# Patient Record
Sex: Female | Born: 1975 | Race: Black or African American | Hispanic: No | Marital: Single | Smoking: Never smoker
Health system: Southern US, Community
[De-identification: ages and names within clinical notes are randomized; demographics above are authoritative.]

## PROBLEM LIST (undated history)

## (undated) DIAGNOSIS — F32A Depression, unspecified: Secondary | ICD-10-CM

## (undated) DIAGNOSIS — F419 Anxiety disorder, unspecified: Secondary | ICD-10-CM

## (undated) DIAGNOSIS — D649 Anemia, unspecified: Secondary | ICD-10-CM

## (undated) DIAGNOSIS — F329 Major depressive disorder, single episode, unspecified: Secondary | ICD-10-CM

## (undated) DIAGNOSIS — E119 Type 2 diabetes mellitus without complications: Secondary | ICD-10-CM

## (undated) DIAGNOSIS — D0510 Intraductal carcinoma in situ of unspecified breast: Secondary | ICD-10-CM

## (undated) HISTORY — PX: OVARIAN CYST REMOVAL: SHX89

---

## 2015-03-17 DIAGNOSIS — E785 Hyperlipidemia, unspecified: Secondary | ICD-10-CM | POA: Insufficient documentation

## 2016-12-21 ENCOUNTER — Other Ambulatory Visit: Payer: Self-pay | Admitting: Family Medicine

## 2016-12-21 DIAGNOSIS — N63 Unspecified lump in unspecified breast: Secondary | ICD-10-CM

## 2016-12-28 ENCOUNTER — Ambulatory Visit
Admission: RE | Admit: 2016-12-28 | Discharge: 2016-12-28 | Disposition: A | Discharge status: Home / Self Care | Payer: BLUE CROSS/BLUE SHIELD | Source: Ambulatory Visit | Attending: Family Medicine | Admitting: Family Medicine

## 2016-12-28 DIAGNOSIS — N6489 Other specified disorders of breast: Secondary | ICD-10-CM | POA: Insufficient documentation

## 2016-12-28 DIAGNOSIS — N6321 Unspecified lump in the left breast, upper outer quadrant: Secondary | ICD-10-CM | POA: Insufficient documentation

## 2016-12-28 DIAGNOSIS — N63 Unspecified lump in unspecified breast: Secondary | ICD-10-CM

## 2017-01-04 ENCOUNTER — Other Ambulatory Visit: Payer: Self-pay | Admitting: Family Medicine

## 2017-01-04 DIAGNOSIS — R928 Other abnormal and inconclusive findings on diagnostic imaging of breast: Secondary | ICD-10-CM

## 2017-01-12 ENCOUNTER — Ambulatory Visit
Admission: RE | Admit: 2017-01-12 | Discharge: 2017-01-12 | Disposition: A | Discharge status: Home / Self Care | Payer: BLUE CROSS/BLUE SHIELD | Source: Ambulatory Visit | Attending: Family Medicine | Admitting: Family Medicine

## 2017-01-12 DIAGNOSIS — R928 Other abnormal and inconclusive findings on diagnostic imaging of breast: Secondary | ICD-10-CM

## 2017-01-12 DIAGNOSIS — N6082 Other benign mammary dysplasias of left breast: Secondary | ICD-10-CM | POA: Insufficient documentation

## 2017-01-27 LAB — SURGICAL PATHOLOGY

## 2017-02-17 ENCOUNTER — Other Ambulatory Visit: Payer: Self-pay

## 2017-02-17 ENCOUNTER — Encounter
Admission: RE | Admit: 2017-02-17 | Discharge: 2017-02-17 | Disposition: A | Discharge status: Home / Self Care | Payer: BLUE CROSS/BLUE SHIELD | Source: Ambulatory Visit | Attending: Surgery | Admitting: Surgery

## 2017-02-17 HISTORY — DX: Major depressive disorder, single episode, unspecified: F32.9

## 2017-02-17 HISTORY — DX: Depression, unspecified: F32.A

## 2017-02-17 HISTORY — DX: Anxiety disorder, unspecified: F41.9

## 2017-02-17 HISTORY — DX: Type 2 diabetes mellitus without complications: E11.9

## 2017-02-17 HISTORY — DX: Anemia, unspecified: D64.9

## 2017-02-17 NOTE — Patient Instructions (Signed)
  Your procedure is scheduled on: 02-24-17 THURSDAY Report to Same Day Surgery 2nd floor medical mall [facility](Medical Mall Entrance-take elevator on left to 2nd floor.  Check in with surgery information desk.) To find out your arrival time please call [phone] between 1PM - 3PM [facility]  Remember: Instructions that are not followed completely may result in serious medical risk, up to and including death, or upon the discretion of your surgeon and anesthesiologist your surgery may need to be rescheduled.    _x___ 1. Do not eat food after midnight the night before your procedure. NO GUM OR CANDY AFTER MIDNIGHT.  You may drink clear liquids up to 2 hours before you are scheduled to arrive at the hospital for your procedure.  Do not drink clear liquids within 2 hours of your scheduled arrival to the hospital.  Clear liquids include  --Water or Apple juice without pulp  --Clear carbohydrate beverage such as ClearFast or Gatorade  --Black Coffee or Clear Tea (No milk, no creamers, do not add anything to the coffee or Tea     __x__ 2. No Alcohol for 24 hours before or after surgery.   __x__3. No Smoking for 24 prior to surgery.   ____  4. Bring all medications with you on the day of surgery if instructed.    __x__ 5. Notify your doctor if there is any change in your medical condition     (cold, fever, infections).     Do not wear jewelry, make-up, hairpins, clips or nail polish.  Do not wear lotions, powders, or perfumes. You may wear deodorant.  Do not shave 48 hours prior to surgery. Men may shave face and neck.  Do not bring valuables to the hospital.    [facility] is not responsible for any belongings or valuables.               Contacts, dentures or bridgework may not be worn into surgery.  Leave your suitcase in the car. After surgery it may be brought to your room.  For patients admitted to the hospital, discharge time is determined by your treatment team.   Patients  discharged the day of surgery will not be allowed to drive home.  You will need someone to drive you home and stay with you the night of your procedure.    Please read over the following fact sheets that you were given:   [facility] Pre[facility] and or MRSA Information    ____ TAKE THE FOLLOWING MEDICATION THE MORNING OF SURGERY WITH A SMALL SIP OF WATER. These include:  1. NONE  2.  3.  4.  5.  6.  ____Fleets enema or Magnesium Citrate as directed.   _x___ Use CHG Soap or sage wipes as directed on instruction sheet   ____ Use inhalers on the day of surgery and bring to hospital day of surgery  ____ Stop Metformin and Janumet 2 days prior to surgery.    ____ Take 1/2 of usual insulin dose the night before surgery and none on the morning surgery.   ____ Follow recommendations from Cardiologist, Pulmonologist or PCP regarding stopping Aspirin, Coumadin, Plavix ,Eliquis, Effient, or Pradaxa, and Pletal.  X____Stop Anti-inflammatories such as Advil, Aleve, Ibuprofen, Motrin, Naproxen, Naprosyn, Goodies powders or aspirin products NOW-OK to take Tylenol   ____ Stop supplements until after surgery.     ____ Bring C-Pap to the hospital.

## 2017-02-18 ENCOUNTER — Encounter
Admission: RE | Admit: 2017-02-18 | Discharge: 2017-02-18 | Disposition: A | Discharge status: Home / Self Care | Payer: BLUE CROSS/BLUE SHIELD | Source: Ambulatory Visit | Attending: Surgery | Admitting: Surgery

## 2017-02-18 DIAGNOSIS — Z01810 Encounter for preprocedural cardiovascular examination: Secondary | ICD-10-CM | POA: Insufficient documentation

## 2017-02-18 DIAGNOSIS — E119 Type 2 diabetes mellitus without complications: Secondary | ICD-10-CM | POA: Insufficient documentation

## 2017-02-18 DIAGNOSIS — Z01812 Encounter for preprocedural laboratory examination: Secondary | ICD-10-CM | POA: Insufficient documentation

## 2017-02-18 LAB — BASIC METABOLIC PANEL
Anion gap: 10 (ref 5–15)
BUN: 15 mg/dL (ref 6–20)
CALCIUM: 9 mg/dL (ref 8.9–10.3)
CO2: 18 mmol/L — AB (ref 22–32)
CREATININE: 0.84 mg/dL (ref 0.44–1.00)
Chloride: 108 mmol/L (ref 101–111)
GFR calc Af Amer: >60 mL/min (ref >60)
GFR calc non Af Amer: >60 mL/min (ref >60)
Glucose, Bld: 214 mg/dL — ABNORMAL HIGH (ref 65–99)
Potassium: 4.8 mmol/L (ref 3.5–5.1)
Sodium: 136 mmol/L (ref 135–145)

## 2017-02-24 ENCOUNTER — Encounter: Payer: Self-pay | Admitting: *Deleted

## 2017-02-24 ENCOUNTER — Ambulatory Visit: Payer: BLUE CROSS/BLUE SHIELD | Admitting: Certified Registered"

## 2017-02-24 ENCOUNTER — Encounter
Admission: RE | Disposition: A | Discharge status: Home / Self Care | Payer: Self-pay | Source: Ambulatory Visit | Attending: Surgery

## 2017-02-24 ENCOUNTER — Ambulatory Visit
Admission: RE | Admit: 2017-02-24 | Discharge: 2017-02-24 | Disposition: A | Discharge status: Home / Self Care | Payer: BLUE CROSS/BLUE SHIELD | Source: Ambulatory Visit | Attending: Surgery | Admitting: Surgery

## 2017-02-24 ENCOUNTER — Other Ambulatory Visit: Payer: Self-pay

## 2017-02-24 DIAGNOSIS — Z7984 Long term (current) use of oral hypoglycemic drugs: Secondary | ICD-10-CM | POA: Diagnosis not present

## 2017-02-24 DIAGNOSIS — F418 Other specified anxiety disorders: Secondary | ICD-10-CM | POA: Insufficient documentation

## 2017-02-24 DIAGNOSIS — D0512 Intraductal carcinoma in situ of left breast: Secondary | ICD-10-CM | POA: Insufficient documentation

## 2017-02-24 DIAGNOSIS — N6320 Unspecified lump in the left breast, unspecified quadrant: Secondary | ICD-10-CM | POA: Diagnosis present

## 2017-02-24 HISTORY — PX: BREAST LUMPECTOMY: SHX2

## 2017-02-24 LAB — GLUCOSE, CAPILLARY
GLUCOSE-CAPILLARY: 192 mg/dL — AB (ref 65–99)
Glucose-Capillary: 227 mg/dL — ABNORMAL HIGH (ref 65–99)

## 2017-02-24 LAB — POCT PREGNANCY, URINE: PREG TEST UR: NEGATIVE

## 2017-02-24 SURGERY — BREAST LUMPECTOMY
Anesthesia: General | Laterality: Left | Wound class: Clean

## 2017-02-24 MED ORDER — MIDAZOLAM HCL 2 MG/2ML IJ SOLN
INTRAMUSCULAR | Status: DC | PRN
  Administered 2017-02-24: 2 mg via INTRAVENOUS

## 2017-02-24 MED ORDER — SODIUM CHLORIDE 0.9 % IV SOLN
INTRAVENOUS | Status: DC
  Administered 2017-02-24: 07:00:00 via INTRAVENOUS

## 2017-02-24 MED ORDER — FENTANYL CITRATE (PF) 100 MCG/2ML IJ SOLN
INTRAMUSCULAR | Status: AC
  Filled 2017-02-24: qty 2

## 2017-02-24 MED ORDER — DEXAMETHASONE SODIUM PHOSPHATE 10 MG/ML IJ SOLN
INTRAMUSCULAR | Status: AC
  Filled 2017-02-24: qty 1

## 2017-02-24 MED ORDER — FAMOTIDINE 20 MG PO TABS
20.0000 mg | ORAL_TABLET | Freq: Once | ORAL | Status: AC
  Administered 2017-02-24: 20 mg via ORAL

## 2017-02-24 MED ORDER — ONDANSETRON HCL 4 MG/2ML IJ SOLN
INTRAMUSCULAR | Status: AC
  Filled 2017-02-24: qty 2

## 2017-02-24 MED ORDER — ONDANSETRON HCL 4 MG/2ML IJ SOLN: 4.0000 mg | Freq: Once | INTRAMUSCULAR | Status: DC | PRN

## 2017-02-24 MED ORDER — PHENYLEPHRINE HCL 10 MG/ML IJ SOLN
INTRAMUSCULAR | Status: DC | PRN
  Administered 2017-02-24 (×2): 100 ug via INTRAVENOUS

## 2017-02-24 MED ORDER — DEXAMETHASONE SODIUM PHOSPHATE 10 MG/ML IJ SOLN
INTRAMUSCULAR | Status: DC | PRN
  Administered 2017-02-24: 5 mg via INTRAVENOUS

## 2017-02-24 MED ORDER — ONDANSETRON HCL 4 MG/2ML IJ SOLN
INTRAMUSCULAR | Status: DC | PRN
  Administered 2017-02-24: 4 mg via INTRAVENOUS

## 2017-02-24 MED ORDER — LIDOCAINE HCL (CARDIAC) 20 MG/ML IV SOLN
INTRAVENOUS | Status: DC | PRN
  Administered 2017-02-24: 100 mg via INTRAVENOUS

## 2017-02-24 MED ORDER — FAMOTIDINE 20 MG PO TABS
ORAL_TABLET | ORAL | Status: AC
  Administered 2017-02-24: 20 mg via ORAL
  Filled 2017-02-24: qty 1

## 2017-02-24 MED ORDER — PROPOFOL 10 MG/ML IV BOLUS
INTRAVENOUS | Status: AC
  Filled 2017-02-24: qty 20

## 2017-02-24 MED ORDER — PROPOFOL 10 MG/ML IV BOLUS
INTRAVENOUS | Status: DC | PRN
  Administered 2017-02-24: 150 mg via INTRAVENOUS

## 2017-02-24 MED ORDER — BUPIVACAINE-EPINEPHRINE (PF) 0.5% -1:200000 IJ SOLN
INTRAMUSCULAR | Status: AC
  Filled 2017-02-24: qty 30

## 2017-02-24 MED ORDER — MIDAZOLAM HCL 2 MG/2ML IJ SOLN
INTRAMUSCULAR | Status: AC
  Filled 2017-02-24: qty 2

## 2017-02-24 MED ORDER — BUPIVACAINE-EPINEPHRINE 0.5% -1:200000 IJ SOLN
INTRAMUSCULAR | Status: DC | PRN
  Administered 2017-02-24: 30 mL

## 2017-02-24 MED ORDER — FENTANYL CITRATE (PF) 100 MCG/2ML IJ SOLN
INTRAMUSCULAR | Status: DC | PRN
  Administered 2017-02-24 (×2): 25 ug via INTRAVENOUS
  Administered 2017-02-24: 50 ug via INTRAVENOUS

## 2017-02-24 MED ORDER — FENTANYL CITRATE (PF) 100 MCG/2ML IJ SOLN: 25.0000 ug | INTRAMUSCULAR | Status: DC | PRN

## 2017-02-24 MED ORDER — LACTATED RINGERS IV SOLN
INTRAVENOUS | Status: DC | PRN
  Administered 2017-02-24: 08:00:00 via INTRAVENOUS

## 2017-02-24 MED ORDER — HYDROCODONE-ACETAMINOPHEN 5-325 MG PO TABS: 1.0000 | ORAL_TABLET | Freq: Four times a day (QID) | ORAL | 0 refills | Status: DC | PRN

## 2017-02-24 MED ORDER — LIDOCAINE HCL (PF) 2 % IJ SOLN
INTRAMUSCULAR | Status: AC
  Filled 2017-02-24: qty 10

## 2017-02-24 SURGICAL SUPPLY — 35 items
BLADE SURG 15 STRL LF DISP TIS (BLADE) ×1 IMPLANT
BLADE SURG 15 STRL SS (BLADE) ×2
CANISTER SUCT 1200ML W/VALVE (MISCELLANEOUS) ×3 IMPLANT
CHLORAPREP W/TINT 26ML (MISCELLANEOUS) ×3 IMPLANT
CNTNR SPEC 2.5X3XGRAD LEK (MISCELLANEOUS) ×1
CONT SPEC 4OZ STER OR WHT (MISCELLANEOUS) ×2
CONTAINER SPEC 2.5X3XGRAD LEK (MISCELLANEOUS) ×1 IMPLANT
DERMABOND ADVANCED (GAUZE/BANDAGES/DRESSINGS) ×2
DERMABOND ADVANCED .7 DNX12 (GAUZE/BANDAGES/DRESSINGS) ×1 IMPLANT
DRAPE LAPAROTOMY 77X122 PED (DRAPES) ×3 IMPLANT
ELECT REM PT RETURN 9FT ADLT (ELECTROSURGICAL) ×3
ELECTRODE REM PT RTRN 9FT ADLT (ELECTROSURGICAL) ×1 IMPLANT
GLOVE BIO SURGEON STRL SZ7.5 (GLOVE) ×3 IMPLANT
GLOVE BIOGEL PI IND STRL 6.5 (GLOVE) ×1 IMPLANT
GLOVE BIOGEL PI IND STRL 7.0 (GLOVE) ×1 IMPLANT
GLOVE BIOGEL PI IND STRL 7.5 (GLOVE) ×1 IMPLANT
GLOVE BIOGEL PI INDICATOR 6.5 (GLOVE) ×2
GLOVE BIOGEL PI INDICATOR 7.0 (GLOVE) ×2
GLOVE BIOGEL PI INDICATOR 7.5 (GLOVE) ×2
GLOVE SURG SYN 6.5 ES PF (GLOVE) ×6 IMPLANT
GOWN STRL REUS W/ TWL LRG LVL3 (GOWN DISPOSABLE) ×2 IMPLANT
GOWN STRL REUS W/TWL LRG LVL3 (GOWN DISPOSABLE) ×4
KIT RM TURNOVER STRD PROC AR (KITS) ×3 IMPLANT
LABEL OR SOLS (LABEL) ×3 IMPLANT
MARGIN MAP 10MM (MISCELLANEOUS) ×3 IMPLANT
NEEDLE HYPO 25X1 1.5 SAFETY (NEEDLE) ×3 IMPLANT
PACK BASIN MINOR ARMC (MISCELLANEOUS) ×3 IMPLANT
SUT CHROMIC 4 0 RB 1X27 (SUTURE) ×3 IMPLANT
SUT ETHILON 3-0 FS-10 30 BLK (SUTURE) ×3
SUT MNCRL 4-0 (SUTURE) ×2
SUT MNCRL 4-0 27XMFL (SUTURE) ×1
SUTURE EHLN 3-0 FS-10 30 BLK (SUTURE) ×1 IMPLANT
SUTURE MNCRL 4-0 27XMF (SUTURE) ×1 IMPLANT
SYR 10ML LL (SYRINGE) ×3 IMPLANT
WATER STERILE IRR 1000ML POUR (IV SOLUTION) ×3 IMPLANT

## 2017-02-24 NOTE — OR Nursing (Signed)
Dr. Malissa HippoW. Smith at bedside for discharge instruction , okay to d/c home.

## 2017-02-24 NOTE — H&P (Signed)
  She came for excision left breast mass.Pathology demonstrated atypical intraductal papillary proliferation.   She reports no change in condition since office visit. Labs noted. Blood sugar 227.  Left side marked YES.  Discussed plan for surgery.

## 2017-02-24 NOTE — Discharge Instructions (Addendum)
AMBULATORY SURGERY  DISCHARGE INSTRUCTIONS   1) The drugs that you were given will stay in your system until tomorrow so for the next 24 hours you should not:  A) Drive an automobile B) Make any legal decisions C) Drink any alcoholic beverage   2) You may resume regular meals tomorrow.  Today it is better to start with liquids and gradually work up to solid foods.  You may eat anything you prefer, but it is better to start with liquids, then soup and crackers, and gradually work up to solid foods.   3) Please notify your doctor immediately if you have any unusual bleeding, trouble breathing, redness and pain at the surgery site, drainage, fever, or pain not relieved by medication. 4)   5) Your post-operative visit with Dr.                                     is: Date:                        Time:    Please call to schedule your post-operative visit.  6) Additional Instructions: Take Tylenol or Norco if needed for pain.  She not drive or do anything dangerous when taking Norco.  May shower and blot dry.  Wear bra as desired for comfort and support.

## 2017-02-24 NOTE — Anesthesia Post-op Follow-up Note (Signed)
Anesthesia QCDR form completed.        

## 2017-02-24 NOTE — Transfer of Care (Signed)
Immediate Anesthesia Transfer of Care Note  [name]  Procedure(s) Performed: BREAST LUMPECTOMY (Left )  Patient Location: PACU  Anesthesia Type:General  Level of Consciousness: awake  Airway & Oxygen Therapy: Patient Spontanous Breathing  Post-op Assessment: Report given to RN  Post vital signs: Reviewed and stable  Last Vitals:  Vitals:   02/24/17 0613 02/24/17 0907  BP: 129/86 135/89  Pulse: 99 87  Resp: 18   Temp: 36.6 C 36.5 C  SpO2: 100% 100%    Last Pain:  Vitals:   02/24/17 0908  TempSrc: Temporal  PainSc:          Complications: No apparent anesthesia complications

## 2017-02-24 NOTE — Anesthesia Postprocedure Evaluation (Signed)
Anesthesia Post Note  Patient: Janet Price  Procedure(s) Performed: BREAST LUMPECTOMY (Left )  Patient location during evaluation: PACU Anesthesia Type: General Level of consciousness: awake and alert and oriented Pain management: pain level controlled Vital Signs Assessment: post-procedure vital signs reviewed and stable Respiratory status: spontaneous breathing Cardiovascular status: blood pressure returned to baseline Anesthetic complications: no     Last Vitals:  Vitals:   02/24/17 0946 02/24/17 0953  BP: 136/84 (!) 144/85  Pulse: 84 82  Resp: 14 16  Temp:  36.7 C  SpO2: 100% 100%    Last Pain:  Vitals:   02/24/17 0953  TempSrc: Tympanic  PainSc: 0-No pain                 Svara Twyman

## 2017-02-24 NOTE — Op Note (Signed)
OPERATIVE REPORT  PREOPERATIVE  DIAGNOSIS: .  Left breast mass  POSTOPERATIVE DIAGNOSIS: .  Left breast mass  PROCEDURE: .  Excision left breast mass  ANESTHESIA:  General  SURGEON: Renda RollsWilton Smith  MD   INDICATIONS: .  She reported a palpable mass in the upper outer quadrant of the left breast.  Needle biopsy demonstrated an atypical ductal papillary proliferation.  Excision was recommended for further evaluation and treatment.  Prior to incision the site was examined with ultrasound identifying the hypoechoic density in the upper outer quadrant  With the patient on the operating table in the supine the left breast was prepared with ChloraPrep and draped in a sterile manner.  A curvilinear incision was made from 12 o'clock position to the 9 o'clock position 4 cm from the nipple.  A narrow ellipse of skin was removed with the underlying specimen.  Dissection was carried down through subcutaneous tissues.  Electrocautery was used for hemostasis.  The mass could be palpated during the course of the dissection.  The mass was excised along with some normal-appearing tissue.  Margin maps were sutured to the specimen to mark the cranial caudal medial lateral superficial and deep margins.  The margins grossly appeared normal.  The specimen was submitted for routine pathology.  The wound was inspected and several small bleeding points were cauterized.  Subcuticular tissues and tissues surrounding cautery on infiltrated with half percent Sensorcaine with epinephrine.  Subcutaneous tissues were approximated with interrupted 4-0 chromic.  The skin was closed with running 4-0 Monocryl subcuticular suture and Dermabond.  The patient tolerated surgery satisfactorily and was then carried to the recovery room for postoperative care.  Renda RollsWilton Smith MD

## 2017-02-24 NOTE — Anesthesia Preprocedure Evaluation (Signed)
Anesthesia Evaluation  Patient identified by MRN, date of birth, ID band Patient awake    Reviewed: Allergy & Precautions, NPO status , Patient's Chart, lab work & pertinent test results  Airway Mallampati: II  TM Distance: >3 FB     Dental  (+) Teeth Intact   Pulmonary neg pulmonary ROS,    Pulmonary exam normal        Cardiovascular negative cardio ROS Normal cardiovascular exam     Neuro/Psych Anxiety Depression negative neurological ROS     GI/Hepatic negative GI ROS, Neg liver ROS,   Endo/Other  diabetes, Well Controlled, Type 2, Oral Hypoglycemic Agents  Renal/GU negative Renal ROS  negative genitourinary   Musculoskeletal negative musculoskeletal ROS (+)   Abdominal Normal abdominal exam  (+)   Peds negative pediatric ROS (+)  Hematology  (+) anemia ,   Anesthesia Other Findings   Reproductive/Obstetrics                             Anesthesia Physical Anesthesia Plan  ASA: II  Anesthesia Plan: General   Post-op Pain Management:    Induction: Intravenous  PONV Risk Score and Plan:   Airway Management Planned: LMA  Additional Equipment:   Intra-op Plan:   Post-operative Plan: Extubation in OR  Informed Consent: I have reviewed the patients History and Physical, chart, labs and discussed the procedure including the risks, benefits and alternatives for the proposed anesthesia with the patient or authorized representative who has indicated his/her understanding and acceptance.   Dental advisory given  Plan Discussed with: CRNA and Surgeon  Anesthesia Plan Comments:         Anesthesia Quick Evaluation

## 2017-02-25 ENCOUNTER — Encounter: Payer: Self-pay | Admitting: Surgery

## 2017-03-03 ENCOUNTER — Other Ambulatory Visit: Payer: Self-pay | Admitting: *Deleted

## 2017-03-04 ENCOUNTER — Encounter: Payer: Self-pay | Admitting: *Deleted

## 2017-03-04 LAB — SURGICAL PATHOLOGY

## 2017-03-04 NOTE — Progress Notes (Signed)
  Oncology Nurse Navigator Documentation  Navigator Location: [facility] Onc (03/04/17 0900)   )Navigator Encounter Type: Introductory phone call (03/04/17 0900)   Abnormal Finding Date: 12/28/17 (03/04/17 0900) Confirmed Diagnosis Date: 03/03/17 (03/04/17 0900) Surgery Date: 02/24/17 (03/04/17 0900)                                            Time Spent with Patient: 15 (03/04/17 0900)   Spoke with Janet Price at Dr. Michaelle CopasSmith's office this morning.  Patient informed of her diagnosis last night.  Ok to call and set up oncology consult.  Spoke to patient this morning.  She would like to come by and pick up her educational literature this afternoon.  Appointment scheduled to see Dr. Cathie HoopsYu on Monday at 10:00.  Patient informed.  Will meet with her later today.

## 2017-03-04 NOTE — Progress Notes (Signed)
Patient came by to pick up patient breast cancer educational literature, "My Breast Cancer Treatment Handbook" by Vianne BullsJudy Neese, RN.  Offered support.  Reviewed pathology again.  Patient scheduled to see Dr. Cathie HoopsYu on Monday at 10:00.

## 2017-03-07 ENCOUNTER — Other Ambulatory Visit: Payer: Self-pay

## 2017-03-07 ENCOUNTER — Inpatient Hospital Stay: Payer: BLUE CROSS/BLUE SHIELD | Attending: Oncology | Admitting: Oncology

## 2017-03-07 ENCOUNTER — Encounter: Payer: Self-pay | Admitting: *Deleted

## 2017-03-07 ENCOUNTER — Encounter: Payer: Self-pay | Admitting: Oncology

## 2017-03-07 VITALS — BP 106/69 | HR 94 | Temp 97.6°F | Ht 68.0 in | Wt 154.2 lb

## 2017-03-07 DIAGNOSIS — Z803 Family history of malignant neoplasm of breast: Secondary | ICD-10-CM | POA: Diagnosis not present

## 2017-03-07 DIAGNOSIS — Z79899 Other long term (current) drug therapy: Secondary | ICD-10-CM | POA: Insufficient documentation

## 2017-03-07 DIAGNOSIS — F419 Anxiety disorder, unspecified: Secondary | ICD-10-CM | POA: Diagnosis not present

## 2017-03-07 DIAGNOSIS — Z170 Estrogen receptor positive status [ER+]: Secondary | ICD-10-CM | POA: Insufficient documentation

## 2017-03-07 DIAGNOSIS — F329 Major depressive disorder, single episode, unspecified: Secondary | ICD-10-CM | POA: Insufficient documentation

## 2017-03-07 DIAGNOSIS — E119 Type 2 diabetes mellitus without complications: Secondary | ICD-10-CM | POA: Diagnosis not present

## 2017-03-07 DIAGNOSIS — D0512 Intraductal carcinoma in situ of left breast: Secondary | ICD-10-CM | POA: Insufficient documentation

## 2017-03-07 NOTE — Progress Notes (Signed)
Patient here today as a new patient with left breast cancer 

## 2017-03-07 NOTE — Progress Notes (Signed)
  Oncology Nurse Navigator Documentation  Navigator Location: [facility] Onc (03/07/17 1100)   )Navigator Encounter Type: Initial MedOnc (03/07/17 1100)         Genetic Counseling Date: 03/07/17 (03/07/17 1100)                                          Time Spent with Patient: 45 (03/07/17 1100)   Met patient during her initial medical oncology visit with Dr. Tasia Catchings.  She was very anxious and desires 2nd opinion at [facility] with Dr. Janan Halter.  Offered support.  She is to call if she has any questions or needs.

## 2017-03-07 NOTE — Progress Notes (Signed)
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  She self palpated an area of tissue hardening of left breast and obtain mammogram.  12/28/2016 patient had diagnostic breast TOMO mammography which revealed left breast 1:00 ill-defined area of hypoechoic tissue which correspond to the palpable abnormality and focal asymmetry mammographically.  Recommend ultrasound guided core needle biopsy of the left breast.  Patient underwent ultrasound-guided core needle biopsy of the left breast same day and pathology revealed left breast 1:00 atypical intraductal papillary proliferation. Patient was seen and examined by surgery Dr. Katrinka BlazingSmith and the patient had excision of left breast mass on February 24, 2017.  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  Focus of nodular adenosis, the superior margin is involved. ER/PR positve,>90%  Patient will meet Dr. Katrinka BlazingSmith this afternoon.  She presents for discussion about pathology and management plan. She denies any pain or discomfort today.  She feels very anxious   And [facility]. She has a positive family history with mother being diagnosed of metastatic triple negative breast cancer at age of 5150s.  This was back in 2015 and and her mother passed away within 6 months of diagnosis.  Denies any other family members diagnosed with breast cancer.  Review of systems- Review of Systems  Constitutional: Negative for fever.    HENT: Negative for hearing loss.   Eyes: Negative for pain.  Respiratory: Negative for cough.   Cardiovascular: Negative for chest pain and leg swelling.  Gastrointestinal: Negative for heartburn.  Genitourinary: Negative for dysuria.  Musculoskeletal: Negative for myalgias.  Skin: Negative for rash.  Neurological: Negative for dizziness.  Endo/Heme/Allergies: Does not bruise/bleed easily.  Psychiatric/Behavioral: The patient is nervous/anxious.     Allergies  Allergen Reactions  . Metformin And Related Nausea Only    There are no active problems to display for this patient.    Past Medical History:  Diagnosis Date  . Anemia    H/O IN HER 20'S  . Anxiety   . Depression   . Diabetes mellitus without complication [facility](HCC)      Past Surgical History:  Procedure Laterality Date  . BREAST LUMPECTOMY Left 02/24/2017   Procedure: BREAST LUMPECTOMY;  Surgeon: Nadeen LandauSmith, Jarvis Wilton, MD;  Location: [facility] ORS;  Service: General;  Laterality: Left;  . OVARIAN CYST REMOVAL      Social History   Socioeconomic History  . Marital status: Single    Spouse name: Not on file  . Number of children: Not on file  . Years of education: Not on file  . Highest education level: Not on file  Social Needs  . Financial resource strain: Not on file  . Food insecurity - worry: Not on file  . Food insecurity - inability: Not on file  . Transportation needs - medical: Not on file  . Transportation needs - non-medical: Not on file  Occupational History  . Not on file  Tobacco Use  . Smoking status: Never Smoker  . Smokeless tobacco: Never Used  Substance and Sexual Activity  .  Alcohol use: No    Frequency: Never  . Drug use: No  . Sexual activity: Not Currently  Other Topics Concern  . Not on file  Social History Narrative  . Not on file     Family History  Problem Relation Age of Onset  . Breast cancer Mother 25  . Diabetes Father      Current Outpatient Medications:  .   atorvastatin (LIPITOR) 20 MG tablet, Take 20 mg by mouth at bedtime., Disp: , Rfl:  .  Azelaic Acid (FINACEA EX), Apply 1 application topically at bedtime as needed., Disp: , Rfl:  .  glyBURIDE (DIABETA) 5 MG tablet, Take 5 mg by mouth 2 (two) times daily with a meal., Disp: , Rfl:  .  losartan (COZAAR) 25 MG tablet, Take 25 mg by mouth every evening. , Disp: , Rfl:  .  sitaGLIPtin (JANUVIA) 100 MG tablet, Take 100 mg by mouth daily., Disp: , Rfl:  .  HYDROcodone-acetaminophen (NORCO) 5-325 MG tablet, Take 1-2 tablets by mouth every 6 (six) hours as needed for moderate pain. (Patient not taking: Reported on 03/07/2017), Disp: 12 tablet, Rfl: 0   Physical exam:  Vitals:   03/07/17 1034  BP: 106/69  Pulse: 94  Temp: 97.6 F (36.4 C)  TempSrc: Tympanic  Weight: 154 lb 4 oz (70 kg)  Height: 5\' 8"  (1.727 m)  ECOG 0 GENERAL:Alert, no distress and comfortable.  EYES: no pallor or icterus OROPHARYNX: no thrush or ulceration;  NECK: supple, no masses felt LYMPH:  no palpable lymphadenopathy in the cervical, axillary or inguinal regions LUNGS: clear to auscultation and  No wheeze or crackles HEART/CVS: regular rate & rhythm and no murmurs; No lower extremity edema ABDOMEN: abdomen soft, non-tender and normal bowel sounds Musculoskeletal:no cyanosis of digits and no clubbing  PSYCH: alert & oriented x 3  NEURO: no focal motor/sensory deficits SKIN:  no rashes or significant lesions Breast exam was performed in seated and lying down position. Patient is status post left breast lumpectomy. No evidence of any palpable masses.No evidence of any palpable masses or lumps in the right breast. No palpable bilateral axillary adenopathy.   Pathology SPECIMEN SUBMITTED:  A. Breast mass, left  DIAGNOSIS:  A. 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  - FOCUS OF NODULAR ADENOSIS.  - THE SUPERIOR MARGIN  IS INVOLVED.  Surgical Pathology Cancer Case Summary  DUCTAL CARCINOMA IN SITU OF THE BREAST:  Procedure: Lumpectomy  Specimen Laterality: Left  Size (Extent) of DCIS: At least 60 mm  Histologic Type: Ductal carcinoma in situ (DCIS)  Nuclear Grade: 2-3  Necrosis: Not identified  Margins: Superior margin is positive, extensive  Regional Lymph nodes: Lymph nodes not submitted or found  Pathologic Stage Classification (pTNM, AJCC 8th Edition): pTis pNx  TNM Descriptors: Not applicable  Note: ER and PR immunohistochemistry is obtained and results will be  reported in an addendum.   GROSS DESCRIPTION:   A. Labeled: left breast mass  Time in fixative:  8:39 AM  Cold Ischemic Time: 6 minutes  Total formalin fixation time 9 hours  Type of specimen: excision  Location of specimen: left  Size of specimen: 5.0 (anterior-posterior) by 4.0 (medial-lateral) by  3.5(anterior-inferior) centimeter  Skin: a thin 2.5 x 0.4 cm ellipse of wrinkled tan skin  Direction of compression: not applicable  Needle localization: no  Orientation of specimen: cranial, caudal, medial, lateral, skin and  deep  metallic markers  Superior = blue  Inferior = green  Medial = yellow  Lateral = orange  Posterior = black  Anterior/Superficial = red/skin  Plane of sectioning: anterior-posterior  Biopsy site: area suggestive of possible biopsy site  Presence/absence of discrete mass: present, ill-defined  Number of discrete masses: 1  Size(s) of mass(es): A-1.9 x 1.1 x 1.1 cm B-0.5 x 0.5 x 0.3 cm  Description of mass(es): tan focally cystic nodularity with surrounding  fibrous tissue  Distance between masses/clips: 1.5 cm  Distance of mass(es)/ biopsy site/clip to surgical margins:  superior-abutting, anterior-0.5 cm from medial-0.5 cm, inferior-1.1  centimeters, lateral-1.6 cm and deep-1.1 cm  There is also a separate 0.5 x 0.5 x 0.3 cm nodule abutting the deep  margin and is separate from previously  described area  Received focally disrupted on the anterior/medial aspect  Description of remainder of tissue: yellow lobulated fibrofatty  Other remarkable features: none noted  Tissue submitted for special investigation: not applicable  Block summary:  1-19-entire mass areas from medial to lateral respectively (3-4, 5-6  (cassette 6 and 4 containing mass B), 7-9, 10-12, 13-15, 16-18 is 1  full-thickness section divided)  Final Diagnosis performed by Glenice Bow, MD. Electronically signed  03/02/2017 4:23:22PM  The electronic signature indicates that the named Attending Pathologist has evaluated the specimen  Technical component performed at [facility], [address] [place],  [place] [address]  Lab: [phone] Dir: Jolene Schimke, MD, MMM  Professional component performed at [facility], [facility],  [address] [place], [place], [place] [address]  Lab: [phone] Dir: Georgiann Cocker. Rubinas, MD  Breast Biomarker Reporting Template: ER and PR  BREAST BIOMARKER TESTS  Estrogen Receptor (ER) Status: POSITIVE, >90% nuclear staining    Average intensity of staining: Strong  Progesterone Receptor (PgR) Status: POSITIVE, >90% nuclear staining    Average intensity of staining: Moderate to strong     Assessment and plan- Patient is a 42 y.o. female presents with newly diagnosed left breast DCIS and family history of breast cancer.   # Left breast DCIS, s/p lumpectomy with positive margin.  Discussed with patient that I recommend reexcision to achieve a minimal margin of 2 mm for DCIS. After surgery, she needs radiation followed by tamoxifen for 5 years.  Patient lives in [place], she can choose to do radiation either at a [facility] regional hospital or at [facility].   Patient appears very anxious today, she mentioned that one of her friend works for [facility] cancer center and recommend her to go there for second opinion.  Will refer patient to see Dr. Tomi Likens at [facility].   #  Family history of breast cancer: Discussed with patient that given her family history of breast cancer, I recommend her to talk to genetic counseling and have genetic testing.   patient want to defer genetic testing right now until she talks to [facility] oncologist. Patient will let us know about her decision after her appointment with [facility] oncologist.  Thank you for this kind referral and the opportunity to participate in the care of this patient  Rickard Patience, MD, PhD Hematology Oncology [facility] at [facility] Pager- [phone] 03/07/2017

## 2017-03-09 ENCOUNTER — Inpatient Hospital Stay: Admission: RE | Admit: 2017-03-09 | Payer: BLUE CROSS/BLUE SHIELD | Source: Ambulatory Visit

## 2017-03-10 ENCOUNTER — Encounter
Admission: RE | Admit: 2017-03-10 | Discharge: 2017-03-10 | Disposition: A | Discharge status: Home / Self Care | Payer: BLUE CROSS/BLUE SHIELD | Source: Ambulatory Visit | Attending: Surgery | Admitting: Surgery

## 2017-03-10 NOTE — Pre-Procedure Instructions (Signed)
SPOKE WITH SHERRY FIELD RN REGARDING PT JUST HAVING RECENT SURGERY ON 02-24-17 WHO IS PUT BACK ON FOR REPEAT SURGERY ON 03-15-17. PT IS DIABETIC AND WHEN MET B WAS DONE IN PAT ON 02-21-17 HER GLUCOSE WAS 214.  DR SMITHS OFFICE GOT PT TO COME BACK TO THEIR OFFICE ON 03-07-17 AND HER GLUCOSE WAS 370.  PT HAD JUST GONE TO [facility] AND EATEN.  SHERRY F SAID TO RECHECK HER GLUCOSE AM OF SURGERY

## 2017-03-10 NOTE — Patient Instructions (Signed)
  Your procedure is scheduled on: 03-15-17 TUESDAY Report to Same Day Surgery 2nd floor medical mall [facility](Medical Mall Entrance-take elevator on left to 2nd floor.  Check in with surgery information desk.) To find out your arrival time please call [phone] between 1PM - 3PM on 03-14-17 MONDAY  Remember: Instructions that are not followed completely may result in serious medical risk, up to and including death, or upon the discretion of your surgeon and anesthesiologist your surgery may need to be rescheduled.    _x___ 1. Do not eat food after midnight the night before your procedure. NO GUM OR CANDY AFTER MIDNIGHT.  You may drink WATER up to 2 hours before you are scheduled to arrive at the hospital for your procedure.  Do not drink WATER within 2 hours of your scheduled arrival to the hospital.  Type 1 and type 2 diabetics should only drink water.    __x__ 2. No Alcohol for 24 hours before or after surgery.   __x__3. No Smoking for 24 prior to surgery.   ____  4. Bring all medications with you on the day of surgery if instructed.    __x__ 5. Notify your doctor if there is any change in your medical condition     (cold, fever, infections).     Do not wear jewelry, make-up, hairpins, clips or nail polish.  Do not wear lotions, powders, or perfumes. You may wear deodorant.  Do not shave 48 hours prior to surgery. Men may shave face and neck.  Do not bring valuables to the hospital.    [facility] is not responsible for any belongings or valuables.               Contacts, dentures or bridgework may not be worn into surgery.  Leave your suitcase in the car. After surgery it may be brought to your room.  For patients admitted to the hospital, discharge time is determined by your treatment team.   Patients discharged the day of surgery will not be allowed to drive home.  You will need someone to drive you home and stay with you the night of your procedure.    Please read over the following  fact sheets that you were given:   [facility] Preparing for Surgery and or MRSA Information   ____ Take anti-hypertensive listed below, cardiac, seizure, asthma,  anti-reflux and psychiatric medicines. These include:  1. NONE  2.  3.  4.  5.  6.  ____Fleets enema or Magnesium Citrate as directed.   ____ Use CHG Soap or sage wipes as directed on instruction sheet   ____ Use inhalers on the day of surgery and bring to hospital day of surgery  __X__ Stop Metformin 2 days prior to surgery-LAST DOSE ON Saturday, FEB 2ND    ____ Take 1/2 of usual insulin dose the night before surgery and none on the morning surgery.   ____ Follow recommendations from Cardiologist, Pulmonologist or PCP regarding   stopping Aspirin, Coumadin, Plavix ,Eliquis, Effient, or Pradaxa, and Pletal.  ____Stop Anti-inflammatories such as Advil, Aleve, Ibuprofen, Motrin, Naproxen, Naprosyn, Goodies powders or aspirin products. OK to take Tylenol    ____ Stop supplements until after surgery.     ____ Bring C-Pap to the hospital.

## 2017-03-15 ENCOUNTER — Ambulatory Visit
Admission: RE | Admit: 2017-03-15 | Discharge: 2017-03-15 | Disposition: A | Discharge status: Home / Self Care | Payer: BLUE CROSS/BLUE SHIELD | Source: Ambulatory Visit | Attending: Surgery | Admitting: Surgery

## 2017-03-15 ENCOUNTER — Ambulatory Visit: Payer: BLUE CROSS/BLUE SHIELD | Admitting: Anesthesiology

## 2017-03-15 ENCOUNTER — Encounter
Admission: RE | Disposition: A | Discharge status: Home / Self Care | Payer: Self-pay | Source: Ambulatory Visit | Attending: Surgery

## 2017-03-15 ENCOUNTER — Other Ambulatory Visit: Payer: Self-pay

## 2017-03-15 DIAGNOSIS — D0512 Intraductal carcinoma in situ of left breast: Secondary | ICD-10-CM | POA: Insufficient documentation

## 2017-03-15 DIAGNOSIS — F329 Major depressive disorder, single episode, unspecified: Secondary | ICD-10-CM | POA: Diagnosis not present

## 2017-03-15 DIAGNOSIS — Z888 Allergy status to other drugs, medicaments and biological substances status: Secondary | ICD-10-CM | POA: Diagnosis not present

## 2017-03-15 DIAGNOSIS — Z79899 Other long term (current) drug therapy: Secondary | ICD-10-CM | POA: Insufficient documentation

## 2017-03-15 DIAGNOSIS — E119 Type 2 diabetes mellitus without complications: Secondary | ICD-10-CM | POA: Insufficient documentation

## 2017-03-15 DIAGNOSIS — F419 Anxiety disorder, unspecified: Secondary | ICD-10-CM | POA: Diagnosis not present

## 2017-03-15 DIAGNOSIS — Z7984 Long term (current) use of oral hypoglycemic drugs: Secondary | ICD-10-CM | POA: Insufficient documentation

## 2017-03-15 HISTORY — PX: MASTECTOMY, PARTIAL: SHX709

## 2017-03-15 LAB — GLUCOSE, CAPILLARY
GLUCOSE-CAPILLARY: 183 mg/dL — AB (ref 65–99)
Glucose-Capillary: 203 mg/dL — ABNORMAL HIGH (ref 65–99)

## 2017-03-15 LAB — POCT PREGNANCY, URINE: Preg Test, Ur: NEGATIVE

## 2017-03-15 SURGERY — MASTECTOMY PARTIAL
Anesthesia: General | Laterality: Left | Wound class: Clean

## 2017-03-15 MED ORDER — SODIUM CHLORIDE 0.9 % IV SOLN
INTRAVENOUS | Status: DC
  Administered 2017-03-15: 07:00:00 via INTRAVENOUS

## 2017-03-15 MED ORDER — BUPIVACAINE-EPINEPHRINE (PF) 0.5% -1:200000 IJ SOLN
INTRAMUSCULAR | Status: DC | PRN
  Administered 2017-03-15: 16 mL via PERINEURAL

## 2017-03-15 MED ORDER — FENTANYL CITRATE (PF) 100 MCG/2ML IJ SOLN: 25.0000 ug | INTRAMUSCULAR | Status: DC | PRN

## 2017-03-15 MED ORDER — MIDAZOLAM HCL 2 MG/2ML IJ SOLN
INTRAMUSCULAR | Status: DC | PRN
  Administered 2017-03-15: 2 mg via INTRAVENOUS

## 2017-03-15 MED ORDER — FENTANYL CITRATE (PF) 100 MCG/2ML IJ SOLN
INTRAMUSCULAR | Status: DC | PRN
  Administered 2017-03-15 (×4): 25 ug via INTRAVENOUS

## 2017-03-15 MED ORDER — BUPIVACAINE-EPINEPHRINE (PF) 0.5% -1:200000 IJ SOLN
INTRAMUSCULAR | Status: AC
  Filled 2017-03-15: qty 30

## 2017-03-15 MED ORDER — FAMOTIDINE 20 MG PO TABS
ORAL_TABLET | ORAL | Status: AC
  Administered 2017-03-15: 20 mg via ORAL
  Filled 2017-03-15: qty 1

## 2017-03-15 MED ORDER — DEXAMETHASONE SODIUM PHOSPHATE 10 MG/ML IJ SOLN
INTRAMUSCULAR | Status: AC
  Filled 2017-03-15: qty 1

## 2017-03-15 MED ORDER — DEXAMETHASONE SODIUM PHOSPHATE 10 MG/ML IJ SOLN
INTRAMUSCULAR | Status: DC | PRN
  Administered 2017-03-15: 5 mg via INTRAVENOUS

## 2017-03-15 MED ORDER — PROPOFOL 10 MG/ML IV BOLUS
INTRAVENOUS | Status: AC
  Filled 2017-03-15: qty 20

## 2017-03-15 MED ORDER — PROPOFOL 10 MG/ML IV BOLUS
INTRAVENOUS | Status: DC | PRN
  Administered 2017-03-15: 150 mg via INTRAVENOUS

## 2017-03-15 MED ORDER — ONDANSETRON HCL 4 MG/2ML IJ SOLN: 4.0000 mg | Freq: Once | INTRAMUSCULAR | Status: DC | PRN

## 2017-03-15 MED ORDER — PHENYLEPHRINE HCL 10 MG/ML IJ SOLN
INTRAMUSCULAR | Status: AC
  Filled 2017-03-15: qty 1

## 2017-03-15 MED ORDER — MIDAZOLAM HCL 2 MG/2ML IJ SOLN
INTRAMUSCULAR | Status: AC
  Filled 2017-03-15: qty 2

## 2017-03-15 MED ORDER — ONDANSETRON HCL 4 MG/2ML IJ SOLN
INTRAMUSCULAR | Status: AC
  Filled 2017-03-15: qty 2

## 2017-03-15 MED ORDER — LIDOCAINE HCL (CARDIAC) 20 MG/ML IV SOLN
INTRAVENOUS | Status: DC | PRN
  Administered 2017-03-15: 60 mg via INTRAVENOUS

## 2017-03-15 MED ORDER — SUCCINYLCHOLINE CHLORIDE 20 MG/ML IJ SOLN
INTRAMUSCULAR | Status: AC
  Filled 2017-03-15: qty 1

## 2017-03-15 MED ORDER — EPHEDRINE SULFATE 50 MG/ML IJ SOLN
INTRAMUSCULAR | Status: AC
  Filled 2017-03-15: qty 1

## 2017-03-15 MED ORDER — HYDROCODONE-ACETAMINOPHEN 5-325 MG PO TABS
ORAL_TABLET | ORAL | Status: DC
Start: 2017-03-15 — End: 2017-03-15
  Filled 2017-03-15: qty 1

## 2017-03-15 MED ORDER — HYDROCODONE-ACETAMINOPHEN 5-325 MG PO TABS: 1.0000 | ORAL_TABLET | Freq: Four times a day (QID) | ORAL | 0 refills | Status: DC | PRN

## 2017-03-15 MED ORDER — HYDROCODONE-ACETAMINOPHEN 5-325 MG PO TABS
1.0000 | ORAL_TABLET | Freq: Four times a day (QID) | ORAL | Status: DC | PRN
  Administered 2017-03-15: 1 via ORAL

## 2017-03-15 MED ORDER — LIDOCAINE HCL (PF) 2 % IJ SOLN
INTRAMUSCULAR | Status: AC
  Filled 2017-03-15: qty 10

## 2017-03-15 MED ORDER — FAMOTIDINE 20 MG PO TABS
20.0000 mg | ORAL_TABLET | Freq: Once | ORAL | Status: AC
  Administered 2017-03-15: 20 mg via ORAL

## 2017-03-15 MED ORDER — PHENYLEPHRINE HCL 10 MG/ML IJ SOLN
INTRAMUSCULAR | Status: DC | PRN
  Administered 2017-03-15 (×4): 100 ug via INTRAVENOUS
  Administered 2017-03-15: 50 ug via INTRAVENOUS
  Administered 2017-03-15: 100 ug via INTRAVENOUS

## 2017-03-15 MED ORDER — ONDANSETRON HCL 4 MG/2ML IJ SOLN
INTRAMUSCULAR | Status: DC | PRN
  Administered 2017-03-15: 4 mg via INTRAVENOUS

## 2017-03-15 MED ORDER — FENTANYL CITRATE (PF) 100 MCG/2ML IJ SOLN
INTRAMUSCULAR | Status: AC
  Filled 2017-03-15: qty 2

## 2017-03-15 SURGICAL SUPPLY — 28 items
BLADE SURG 15 STRL LF DISP TIS (BLADE) ×1 IMPLANT
BLADE SURG 15 STRL SS (BLADE) ×2
CANISTER SUCT 1200ML W/VALVE (MISCELLANEOUS) ×3 IMPLANT
CHLORAPREP W/TINT 26ML (MISCELLANEOUS) ×3 IMPLANT
CNTNR SPEC 2.5X3XGRAD LEK (MISCELLANEOUS) ×1
CONT SPEC 4OZ STER OR WHT (MISCELLANEOUS) ×2
CONTAINER SPEC 2.5X3XGRAD LEK (MISCELLANEOUS) ×1 IMPLANT
DERMABOND ADVANCED (GAUZE/BANDAGES/DRESSINGS) ×2
DERMABOND ADVANCED .7 DNX12 (GAUZE/BANDAGES/DRESSINGS) ×1 IMPLANT
DRAPE LAPAROTOMY 77X122 PED (DRAPES) ×3 IMPLANT
ELECT REM PT RETURN 9FT ADLT (ELECTROSURGICAL) ×3
ELECTRODE REM PT RTRN 9FT ADLT (ELECTROSURGICAL) ×1 IMPLANT
GLOVE BIO SURGEON STRL SZ7.5 (GLOVE) ×3 IMPLANT
GOWN STRL REUS W/ TWL LRG LVL3 (GOWN DISPOSABLE) ×2 IMPLANT
GOWN STRL REUS W/TWL LRG LVL3 (GOWN DISPOSABLE) ×4
KIT TURNOVER KIT A (KITS) ×3 IMPLANT
LABEL OR SOLS (LABEL) ×3 IMPLANT
MARGIN MAP 10MM (MISCELLANEOUS) ×3 IMPLANT
NEEDLE HYPO 25X1 1.5 SAFETY (NEEDLE) ×3 IMPLANT
PACK BASIN MINOR ARMC (MISCELLANEOUS) ×3 IMPLANT
SUT CHROMIC 4 0 RB 1X27 (SUTURE) ×3 IMPLANT
SUT ETHILON 3-0 FS-10 30 BLK (SUTURE) ×3
SUT MNCRL 4-0 (SUTURE) ×2
SUT MNCRL 4-0 27XMFL (SUTURE) ×1
SUTURE EHLN 3-0 FS-10 30 BLK (SUTURE) ×1 IMPLANT
SUTURE MNCRL 4-0 27XMF (SUTURE) ×1 IMPLANT
SYR 10ML LL (SYRINGE) ×3 IMPLANT
WATER STERILE IRR 1000ML POUR (IV SOLUTION) ×3 IMPLANT

## 2017-03-15 NOTE — Op Note (Signed)
OPERATIVE REPORT  PREOPERATIVE  DIAGNOSIS: .  Ductal carcinoma in situ of left breast  POSTOPERATIVE DIAGNOSIS: .  Ductal carcinoma in situ of left breast  PROCEDURE: .  Left partial mastectomy  ANESTHESIA:  General  SURGEON: Renda RollsWilton Rollin Kotowski  MD   INDICATIONS: .  She has a history of a palpable mass in the upper aspect of the left breast.  She had recent excision with findings of ductal carcinoma in situ which involved the superior margin.  Additional surgery was recommended for definitive treatment.  With the patient on the operating table in the supine position she was placed under general anesthesia.  The left arm was placed on the lateral arm support.  The left breast was prepared with ChloraPrep and draped in a sterile manner.  An incision was made at the site of the old scar in the upper aspect of the left breast approximately 12 mm from the border of the areola.  The scar which extended from 11:00 to 2 o'clock position was excised and sent separately for pathology.  Dissection was carried down through subcutaneous tissues with electrocautery for hemostasis.  A seroma was encountered and aspirated.  A small amount of clotted blood was also removed.  Examination of the inside of the seroma demonstrated no obvious tumor.  With retractors in place the electrocautery was used to score an incision to begin a wide excision of the superior margin.  This incision was carried out circumferentially and removed greater than a centimeter of tissue from medial to lateral and from anterior to posterior to include the superior margin.  As this was being dissected 3-0 nylon sutures were used to mark the medial margin.  After excision the lateral and superficial and deep margins were also labeled.  Next the superior margin was marked with the cranial marker.  This was submitted for pathology.  The wound was inspected and several small bleeding points were cauterized.  Hemostasis was intact.  There was no remaining  visible or palpable mass.  The subcuticular tissues were infiltrated with 0.5% Sensorcaine with epinephrine.  Also deeper tissues surrounding cautery artifact were infiltrated.  The pathologist called to say that if there appeared to be an adequate margin resected and tissues were submitted for routine pathology.  Subcutaneous tissues were closed with interrupted 4-0 chromic.  The skin was closed with running 4-0 Monocryl subcuticular suture and Dermabond.  The patient tolerated surgery satisfactorily and was then prepared for transfer to the recovery room  Renda RollsWilton Angelyn Osterberg MD

## 2017-03-15 NOTE — H&P (Signed)
  She comes for left partial mastectomy.  She reports no change in condition since office exam.   Blood sugar this AM 183.  Left side marked YES.  Discussed plan for surgery and post op care.

## 2017-03-15 NOTE — Anesthesia Preprocedure Evaluation (Signed)
Anesthesia Evaluation  Patient identified by MRN, date of birth, ID band Patient awake    Reviewed: Allergy & Precautions, NPO status , Patient's Chart, lab work & pertinent test results  Airway Mallampati: II  TM Distance: >3 FB     Dental  (+) Teeth Intact   Pulmonary neg pulmonary ROS,    Pulmonary exam normal        Cardiovascular negative cardio ROS Normal cardiovascular exam     Neuro/Psych Anxiety Depression negative neurological ROS     GI/Hepatic negative GI ROS, Neg liver ROS,   Endo/Other  diabetes, Well Controlled, Type 2, Oral Hypoglycemic Agents  Renal/GU negative Renal ROS  negative genitourinary   Musculoskeletal negative musculoskeletal ROS (+)   Abdominal Normal abdominal exam  (+)   Peds negative pediatric ROS (+)  Hematology  (+) anemia ,   Anesthesia Other Findings   Reproductive/Obstetrics                             Anesthesia Physical Anesthesia Plan  ASA: II  Anesthesia Plan: General   Post-op Pain Management:    Induction: Intravenous  PONV Risk Score and Plan:   Airway Management Planned: LMA  Additional Equipment:   Intra-op Plan:   Post-operative Plan: Extubation in OR  Informed Consent: I have reviewed the patients History and Physical, chart, labs and discussed the procedure including the risks, benefits and alternatives for the proposed anesthesia with the patient or authorized representative who has indicated his/her understanding and acceptance.   Dental advisory given  Plan Discussed with: CRNA and Surgeon  Anesthesia Plan Comments:         Anesthesia Quick Evaluation  

## 2017-03-15 NOTE — Transfer of Care (Signed)
Immediate Anesthesia Transfer of Care Note  Patient: Janet Price  Procedure(s) Performed: MASTECTOMY PARTIAL (Left )  Patient Location: PACU  Anesthesia Type:General  Level of Consciousness: awake, alert  and oriented  Airway & Oxygen Therapy: Patient Spontanous Breathing and Patient connected to face mask oxygen  Post-op Assessment: Report given to RN and Post -op Vital signs reviewed and stable  Post vital signs: Reviewed and stable  Last Vitals:  Vitals:   03/15/17 0613 03/15/17 0909  BP: (!) 141/80 139/86  Pulse: 98 93  Resp: 16 (!) 26  Temp: 36.8 C 36.6 C  SpO2: 100% 100%    Last Pain:  Vitals:   03/15/17 0613  TempSrc: Temporal         Complications: No apparent anesthesia complications

## 2017-03-15 NOTE — OR Nursing (Signed)
Okay to discharge home per dr. Katrinka BlazingSmith.

## 2017-03-15 NOTE — Anesthesia Procedure Notes (Signed)
Procedure Name: LMA Insertion Date/Time: 03/15/2017 7:32 AM Performed by: Janet Price, Janet Laday, CRNA Pre-anesthesia Checklist: Patient identified, Patient being monitored, Timeout performed, Emergency Drugs available and Suction available Patient Re-evaluated:Patient Re-evaluated prior to induction Oxygen Delivery Method: Circle system utilized Preoxygenation: Pre-oxygenation with 100% oxygen Induction Type: IV induction Ventilation: Mask ventilation without difficulty LMA: LMA inserted LMA Size: 3.5 Tube type: Oral Number of attempts: 1 Placement Confirmation: positive ETCO2 and breath sounds checked- equal and bilateral Tube secured with: Tape Dental Injury: Teeth and Oropharynx as per pre-operative assessment

## 2017-03-15 NOTE — Discharge Instructions (Addendum)
AMBULATORY SURGERY  DISCHARGE INSTRUCTIONS   1) The drugs that you were given will stay in your system until tomorrow so for the next 24 hours you should not:  A) Drive an automobile B) Make any legal decisions C) Drink any alcoholic beverage   2) You may resume regular meals tomorrow.  Today it is better to start with liquids and gradually work up to solid foods.  You may eat anything you prefer, but it is better to start with liquids, then soup and crackers, and gradually work up to solid foods.   3) Please notify your doctor immediately if you have any unusual bleeding, trouble breathing, redness and pain at the surgery site, drainage, fever, or pain not relieved by medication. 4)   5) Your post-operative visit with Dr.                                     is: Date:                        Time:    Please call to schedule your post-operative visit.  6) Additional Instructions:      Take Tylenol or Norco if needed for pain.  Should not drive or do anything dangerous when taking Norco.  May shower and blot dry.  Wear bra as desired for comfort and support.  AMBULATORY SURGERY  DISCHARGE INSTRUCTIONS   7) The drugs that you were given will stay in your system until tomorrow so for the next 24 hours you should not:  D) Drive an automobile E) Make any legal decisions F) Drink any alcoholic beverage   8) You may resume regular meals tomorrow.  Today it is better to start with liquids and gradually work up to solid foods.  You may eat anything you prefer, but it is better to start with liquids, then soup and crackers, and gradually work up to solid foods.   9) Please notify your doctor immediately if you have any unusual bleeding, trouble breathing, redness and pain at the surgery site, drainage, fever, or pain not relieved by medication.    10) Additional Instructions:        Please contact your physician with any problems or Same Day Surgery at  [phone], Monday through Friday 6 am to 4 pm, or  at [facility] Main number at [phone].

## 2017-03-15 NOTE — Anesthesia Post-op Follow-up Note (Signed)
Anesthesia QCDR form completed.        

## 2017-03-15 NOTE — Anesthesia Postprocedure Evaluation (Signed)
Anesthesia Post Note  Patient: Janet Price  Procedure(s) Performed: MASTECTOMY PARTIAL (Left )  Patient location during evaluation: PACU Anesthesia Type: General Level of consciousness: awake and alert and oriented Pain management: pain level controlled Vital Signs Assessment: post-procedure vital signs reviewed and stable Respiratory status: spontaneous breathing Cardiovascular status: blood pressure returned to baseline Anesthetic complications: no     Last Vitals:  Vitals:   03/15/17 1001 03/15/17 1039  BP: 131/79 112/75  Pulse: 88 98  Resp: 16 16  Temp: 36.7 C   SpO2: 100% 100%    Last Pain:  Vitals:   03/15/17 1039  TempSrc:   PainSc: 2                  Brantlee Penn

## 2017-03-16 LAB — SURGICAL PATHOLOGY

## 2017-03-18 ENCOUNTER — Encounter: Payer: Self-pay | Admitting: *Deleted

## 2017-03-18 NOTE — Progress Notes (Signed)
  Oncology Nurse Navigator Documentation  Navigator Location: [facility] (03/18/17 1300)   )Navigator Encounter Type: Telephone (03/18/17 1300) Telephone: Outgoing Call (03/18/17 1300)     Surgery Date: 03/15/17 (03/18/17 1300)                                            Time Spent with Patient: 15 (03/18/17 1300)   Called patient today.  She is doing well post surgery.  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  She is anticipating a call from Dr. Katrinka BlazingSmith with her final pathology.  Informed her sometimes his office is closed on Friday afternoon.  She is going to call Monday if she has not heard from him.  She does not want to schedule follow-up with Dr. Cathie HoopsYu at this time, but prefers to wait until after her follow up with Dr. Katrinka BlazingSmith on the 18th.  She is to call if she has any questions or needs.

## 2017-04-06 ENCOUNTER — Other Ambulatory Visit: Payer: Self-pay

## 2017-04-06 ENCOUNTER — Inpatient Hospital Stay: Payer: BLUE CROSS/BLUE SHIELD | Attending: Oncology | Admitting: Oncology

## 2017-04-06 ENCOUNTER — Encounter: Payer: Self-pay | Admitting: Oncology

## 2017-04-06 VITALS — BP 147/87 | HR 103 | Temp 97.3°F | Wt 154.0 lb

## 2017-04-06 DIAGNOSIS — E785 Hyperlipidemia, unspecified: Secondary | ICD-10-CM | POA: Diagnosis not present

## 2017-04-06 DIAGNOSIS — F418 Other specified anxiety disorders: Secondary | ICD-10-CM | POA: Diagnosis not present

## 2017-04-06 DIAGNOSIS — Z170 Estrogen receptor positive status [ER+]: Secondary | ICD-10-CM | POA: Diagnosis not present

## 2017-04-06 DIAGNOSIS — Z9012 Acquired absence of left breast and nipple: Secondary | ICD-10-CM | POA: Insufficient documentation

## 2017-04-06 DIAGNOSIS — E119 Type 2 diabetes mellitus without complications: Secondary | ICD-10-CM | POA: Diagnosis not present

## 2017-04-06 DIAGNOSIS — Z803 Family history of malignant neoplasm of breast: Secondary | ICD-10-CM | POA: Insufficient documentation

## 2017-04-06 DIAGNOSIS — D0512 Intraductal carcinoma in situ of left breast: Secondary | ICD-10-CM | POA: Diagnosis not present

## 2017-04-06 DIAGNOSIS — Z79810 Long term (current) use of selective estrogen receptor modulators (SERMs): Secondary | ICD-10-CM | POA: Diagnosis not present

## 2017-04-06 DIAGNOSIS — Z923 Personal history of irradiation: Secondary | ICD-10-CM | POA: Diagnosis not present

## 2017-04-06 DIAGNOSIS — I10 Essential (primary) hypertension: Secondary | ICD-10-CM | POA: Diagnosis not present

## 2017-04-06 DIAGNOSIS — Z79899 Other long term (current) drug therapy: Secondary | ICD-10-CM | POA: Insufficient documentation

## 2017-04-06 NOTE — Progress Notes (Signed)
Hematology/Oncology follow up. note [facility] [phone] [phone]  Patient Care Team: Wilford Corner, PA-C as PCP - General (Family Medicine)   Name of the patient: Janet Price  [id number]  [birth year]   Date of visit: 04/06/17 REASON FOR COSULTATION:  Evaluation of recently diagnosed DCIS.  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  She self palpated an area of tissue hardening of left breast and obtain mammogram.  12/28/2016 patient had diagnostic breast TOMO mammography which revealed left breast 1:00 ill-defined area of hypoechoic tissue which correspond to the palpable abnormality and focal asymmetry mammographically.  Recommend ultrasound guided core needle biopsy of the left breast.  Patient underwent ultrasound-guided core needle biopsy of the left breast same day and pathology revealed left breast 1:00 atypical intraductal papillary proliferation. Patient was seen and examined by surgery Dr. Katrinka Blazing and the patient had excision of left breast mass on February 24, 2017.  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  Focus of nodular adenosis, the superior margin is involved. ER/PR positve,>90%  INTERVAL HISTORY Janet Price is a 42 y.o. female who has above history reviewed by me today presents for follow up visit for management of DCIS. During the interval, She has re- excision done by Dr.Smith.  Pathology revealed DCIS with close superior margin (<0.15mm).  She originally plan to go to [facility] for second opinion, but now she wants to continue follow up with me and do radiation at [facility] which is closer to her home.  Wound has healed well. No new complaints.  She has a positive family history with mother being diagnosed of metastatic triple negative  breast cancer at age of 10s.  This was back in 2015 and and her mother passed away within 6 months of diagnosis.  Denies any other family members diagnosed with breast cancer.  Review of systems- Review of Systems  Constitutional: Negative for chills, fever and weight loss.  HENT: Negative for hearing loss and nosebleeds.   Eyes: Negative for pain.  Respiratory: Negative for cough and sputum production.   Cardiovascular: Negative for chest pain and leg swelling.  Gastrointestinal: Negative for abdominal pain, heartburn, nausea and vomiting.  Genitourinary: Negative for dysuria and frequency.  Musculoskeletal: Negative for myalgias.  Skin: Negative for rash.  Neurological: Negative for dizziness and tremors.  Endo/Heme/Allergies: Does not bruise/bleed easily.  Psychiatric/Behavioral: Negative for depression. The patient is nervous/anxious.     Allergies  Allergen Reactions  . Metformin And Related Nausea Only    Patient Active Problem List   Diagnosis Date Noted  . Type 2 diabetes mellitus without complication, without long-term current use of insulin (HCC) 04/06/2017  . Essential hypertension 04/06/2017  . Ductal carcinoma in situ (DCIS) of left breast 04/06/2017  . Hyperlipidemia LDL goal <100 03/17/2015     Past Medical History:  Diagnosis Date  . Anemia    H/O IN HER 20'S  . Anxiety   . Depression   . Diabetes mellitus without complication [facility])      Past Surgical History:  Procedure Laterality Date  . BREAST LUMPECTOMY Left 02/24/2017   Procedure: BREAST LUMPECTOMY;  Surgeon: Nadeen Landau, MD;  Location: [facility] ORS;  Service: General;  Laterality: Left;  Marland Kitchen MASTECTOMY, PARTIAL Left 03/15/2017   Procedure: MASTECTOMY PARTIAL;  Surgeon: Nadeen Landau, MD;  Location: [facility] ORS;  Service: General;  Laterality: Left;  . OVARIAN CYST REMOVAL      Social History   Socioeconomic History  . Marital status: Single    Spouse name: Not on file  . Number of  children: Not on file  . Years of education: Not on file  . Highest education level: Not on file  Social Needs  . Financial resource strain: Not on file  . Food insecurity - worry: Not on file  . Food insecurity - inability: Not on file  . Transportation needs - medical: Not on file  . Transportation needs - non-medical: Not on file  Occupational History  . Not on file  Tobacco Use  . Smoking status: Never Smoker  . Smokeless tobacco: Never Used  Substance and Sexual Activity  . Alcohol use: No    Frequency: Never  . Drug use: No  . Sexual activity: Not Currently  Other Topics Concern  . Not on file  Social History Narrative  . Not on file     Family History  Problem Relation Age of Onset  . Breast cancer Mother 19  . Diabetes Father      Current Outpatient Medications:  .  atorvastatin (LIPITOR) 20 MG tablet, Take 20 mg by mouth at bedtime., Disp: , Rfl:  .  Azelaic Acid (FINACEA EX), Apply 1 application topically at bedtime as needed., Disp: , Rfl:  .  glyBURIDE (DIABETA) 5 MG tablet, Take 5 mg by mouth 2 (two) times daily with a meal., Disp: , Rfl:  .  HYDROcodone-acetaminophen (NORCO) 5-325 MG tablet, Take 1-2 tablets by mouth every 6 (six) hours as needed for moderate pain., Disp: 12 tablet, Rfl: 0 .  HYDROcodone-acetaminophen (NORCO) 5-325 MG tablet, Take 1-2 tablets by mouth every 6 (six) hours as needed for moderate pain., Disp: 12 tablet, Rfl: 0 .  losartan (COZAAR) 25 MG tablet, Take 25 mg by mouth every evening. PT STATES SHE MISSES DOSES OCC, Disp: , Rfl:  .  metFORMIN (GLUCOPHAGE) 1000 MG tablet, Take 1,000 mg by mouth daily., Disp: , Rfl:  .  sitaGLIPtin (JANUVIA) 100 MG tablet, Take 100 mg by mouth daily., Disp: , Rfl:    Physical exam:  Vitals:   04/06/17 1041  BP: (!) 147/87  Pulse: (!) 103  Temp: (!) 97.3 F (36.3 C)  TempSrc: Tympanic  Weight: 154 lb (69.9 kg)  ECOG 0 GENERAL:Alert, no distress and comfortable.  EYES: no pallor or  icterus OROPHARYNX: no thrush or ulceration;  NECK: supple, no masses felt LYMPH:  no palpable lymphadenopathy in the cervical, axillary or inguinal regions LUNGS: clear to auscultation and  No wheeze or crackles HEART/CVS: regular rate & rhythm and no murmurs; No lower extremity edema ABDOMEN: abdomen soft, non-tender and normal bowel sounds Musculoskeletal:no cyanosis of digits and no clubbing  PSYCH: alert & oriented x 3  NEURO: no focal motor/sensory deficits SKIN:  no rashes or significant lesions Breast exam was performed in seated and lying down position. Patient is status post left breast lumpectomy. No evidence of any palpable masses.No evidence of any palpable masses or lumps in the right breast. No palpable bilateral axillary adenopathy.   02/24/2017 Pathology SPECIMEN SUBMITTED:  A. Breast mass, left  DIAGNOSIS:  A. 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  - FOCUS OF NODULAR ADENOSIS.  - THE SUPERIOR MARGIN IS INVOLVED.  Surgical Pathology Cancer Case Summary  DUCTAL CARCINOMA  IN SITU OF THE BREAST:  Procedure: Lumpectomy  Specimen Laterality: Left  Size (Extent) of DCIS: At least 60 mm  Histologic Type: Ductal carcinoma in situ (DCIS)  Nuclear Grade: 2-3  Necrosis: Not identified  Margins: Superior margin is positive, extensive  Regional Lymph nodes: Lymph nodes not submitted or found  Pathologic Stage Classification (pTNM, AJCC 8th Edition): pTis pNx  BREAST BIOMARKER TESTS  Estrogen Receptor (ER) Status: POSITIVE, >90% nuclear staining    Average intensity of staining: Strong  Progesterone Receptor (PgR) Status: POSITIVE, >90% nuclear staining    Average intensity of staining: Moderate to strong   03/15/2017 Pathology DIAGNOSIS:  A. BREAST MASS, LEFT; RE-EXCISION SUPERIOR MARGIN:  - RESIDUAL DUCTAL CARCINOMA IN SITU (DCIS) ADJACENT TO BIOPSY SITE CHANGE.   - DCIS IS 0.3MM TO SUPERIOR / ANTERIOR MARGIN; REMAINING MARGINS ARE >2MM.   B. BREAST SCAR, LEFT; EXCISION:  - SKIN WITH CHANGES CONSISTENT WITH RECENT SURGICAL SITE.   Assessment and plan- Patient is a [age] y.o. female presents with newly diagnosed left breast DCIS and family history of breast cancer.   # Left breast DCIS, s/p lumpectomy with positive margin, s/p re-exision with close superior margin.  Discussed with patient about the following options. 1) Theoretically another resection to achieve margin >622mm is desired, however per Dr.Smith, this may get too close to skin and may have to cut a good portion of skin tissue. 2) Mastectomy. 3) Radiation, hopefully will reduce the rate of local recurrence.  Patient feels that option 1 and 2 are not acceptable. She prefers option 3.  [facility]   # After radiation I recommend her to start Tamoxifen for chemotherapy prevention. I spent time explaining the mechanism of tamoxifen, rationale and potential side effects, including but not limited to risk of blood clots, risk of developing endometrial cancer, hot flush, mood swing, etc.  She prefers to finish RT and then discuss more about this when she follows up with me after RT.   # Genetic testing discussed with patient and she prefers to concentrate on RT for now. Will re-visit this at next appoitment.   Follow up after RT. Patient will call cancer center when she is about to finish RT.  Total face to face encounter time for this patient visit was 40 min. >50% of the time was  spent in counseling and coordination of care.  Rickard PatienceZhou Vadie Principato, MD, PhD Hematology Oncology [facility] Pager- [phone] 04/06/2017

## 2017-04-06 NOTE — Progress Notes (Signed)
Patient here today post left breast surgery.  Patient has no new concerns today

## 2017-04-11 ENCOUNTER — Encounter: Payer: Self-pay | Admitting: Radiation Oncology

## 2017-04-11 ENCOUNTER — Other Ambulatory Visit: Payer: Self-pay | Admitting: *Deleted

## 2017-04-11 DIAGNOSIS — D0512 Intraductal carcinoma in situ of left breast: Secondary | ICD-10-CM

## 2017-04-12 NOTE — Progress Notes (Signed)
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 BREAST MASS, LEFT; RE-EXCISION SUPERIOR MARGIN:  - RESIDUAL DUCTAL CARCINOMA IN SITU (DCIS) ADJACENT TO BIOPSY SITE  CHANGE.  - DCIS IS 0.3MM TO SUPERIOR / ANTERIOR MARGIN; REMAINING MARGINS ARE  >2MM.   B. BREAST SCAR, LEFT; EXCISION:  - SKIN WITH CHANGES CONSISTENT WITH RECENT SURGICAL SITE.   Comment:  Residual DCIS is present along the "old" margin and adjacent to prior  biopsy site change. DCIS is present in 5 blocks with the largest linear  focus measuring 7 mm.   02/24/17 DIAGNOSIS:  A. 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  - FOCUS OF NODULAR ADENOSIS.  - THE SUPERIOR MARGIN IS INVOLVED.   01/12/17 DIAGNOSIS:  A. LEFT BREAST, 1:00, 3 CMFN; BIOPSY:  - ATYPICAL INTRADUCTAL PAPILLARY PROLIFERATION  Receptor Status: ER(>90%), PR (>90%), Her2-neu (), Ki-()  Did patient present with symptoms (if so, please note symptoms) or was this found on screening mammography?: patient self palpated an area of tissue hardening of left breast   Past/Anticipated interventions by surgeon, if any:   02/24/17 - Procedure: BREAST LUMPECTOMY;  Surgeon: Leonie Green, MD 03/15/17 - Procedure: MASTECTOMY PARTIAL;  Surgeon: Leonie Green, MD  Past/Anticipated interventions by medical oncology, if any: Tamoxifen  Lymphedema issues, if any:  no}   Pain issues, if any:  no   SAFETY ISSUES:  Prior radiation? no  Pacemaker/ICD? no  Possible current pregnancy?no  Is the patient on methotrexate? no  Current Complaints / other details:  Patient has a history of breast cancer in her mother, prostate cancer in her maternal grandfather..  BP 121/78 (BP Location: Right Arm, Patient Position: Sitting)   Pulse 94   Temp 98.7 F (37.1 C) (Oral)   Ht 5' 8"  (1.727 m)   Wt 155  lb 12.8 oz (70.7 kg)   LMP 03/22/2017   SpO2 100%   BMI 23.69 kg/m    Wt Readings from Last 3 Encounters:  04/14/17 155 lb 12.8 oz (70.7 kg)  04/06/17 154 lb (69.9 kg)  03/15/17 154 lb (69.9 kg)      Jacqulyn Liner, RN 04/12/2017,9:23 AM

## 2017-04-14 ENCOUNTER — Encounter: Payer: Self-pay | Admitting: Radiation Oncology

## 2017-04-14 ENCOUNTER — Ambulatory Visit
Admission: RE | Admit: 2017-04-14 | Discharge: 2017-04-14 | Disposition: A | Discharge status: Home / Self Care | Payer: BLUE CROSS/BLUE SHIELD | Source: Ambulatory Visit | Attending: Radiation Oncology | Admitting: Radiation Oncology

## 2017-04-14 ENCOUNTER — Other Ambulatory Visit: Payer: Self-pay

## 2017-04-14 DIAGNOSIS — Z79899 Other long term (current) drug therapy: Secondary | ICD-10-CM | POA: Insufficient documentation

## 2017-04-14 DIAGNOSIS — Z9889 Other specified postprocedural states: Secondary | ICD-10-CM | POA: Insufficient documentation

## 2017-04-14 DIAGNOSIS — Z803 Family history of malignant neoplasm of breast: Secondary | ICD-10-CM | POA: Diagnosis not present

## 2017-04-14 DIAGNOSIS — Z9012 Acquired absence of left breast and nipple: Secondary | ICD-10-CM | POA: Diagnosis not present

## 2017-04-14 DIAGNOSIS — F329 Major depressive disorder, single episode, unspecified: Secondary | ICD-10-CM | POA: Diagnosis not present

## 2017-04-14 DIAGNOSIS — E119 Type 2 diabetes mellitus without complications: Secondary | ICD-10-CM | POA: Insufficient documentation

## 2017-04-14 DIAGNOSIS — D0512 Intraductal carcinoma in situ of left breast: Secondary | ICD-10-CM | POA: Insufficient documentation

## 2017-04-14 DIAGNOSIS — F419 Anxiety disorder, unspecified: Secondary | ICD-10-CM | POA: Diagnosis not present

## 2017-04-14 DIAGNOSIS — Z833 Family history of diabetes mellitus: Secondary | ICD-10-CM | POA: Diagnosis not present

## 2017-04-14 DIAGNOSIS — Z7984 Long term (current) use of oral hypoglycemic drugs: Secondary | ICD-10-CM | POA: Insufficient documentation

## 2017-04-14 DIAGNOSIS — Z170 Estrogen receptor positive status [ER+]: Secondary | ICD-10-CM | POA: Insufficient documentation

## 2017-04-14 HISTORY — DX: Intraductal carcinoma in situ of unspecified breast: D05.10

## 2017-04-14 NOTE — Progress Notes (Signed)
Radiation Oncology         (336) [phone] ________________________________  Initial Outpatient Consultation  Name: Janet Price MRN: [id number]  Date: 04/14/2017  DOB: 1976/02/07  CC:Whitaker, Jonnie FinnerJason Hestle, PA-C  Rickard PatienceYu, Zhou, MD   REFERRING PHYSICIAN: Rickard PatienceYu, Zhou, MD  DIAGNOSIS: DCIS of the left breast, nuclear grade II-III, ER+/PR+  HISTORY OF PRESENT ILLNESS::Janet Price is a 42 y.o. female who is seen out courtesy of Dr. Cathie HoopsYu. The presents with a diagnosis of DCIS of the left breast. The patient self palpated a hardened area in the upper outer quadrant of the left breast. Subsequent mammogram on 12/28/16 revealed 1 o'clock ill-defined hypoechoic tissue of the left breast corresponding to the palpable abnormality. The patient proceeded to undergo ultrasound of the left breast on the same day. This showed a 1.5 x 3.6 x 1.2 cm ill-defined area of hypoechoic tissue, 3 cm from the nipple in the left breast. Biopsy of the left breast on 01/12/17 revealed atypical intraductal papillary proliferation. Receptor status was ER 90%, PR 90%. The patient then underwent a left breast lumpectomy on 02/24/17 with Dr. Gaetano NetJarvis Smith. This showed DCIS, grade II/III with papillary features and extensive involvement of lobules. There was a focus of nodular adenosis. Superior margin was involved. The patient then underwent re-excision of the superior margin and excision of the breast scar of the left breast on 03/15/17. Re-excision of the superior margin showed DCIS adjacent to the biopsy site change. DCIS was noted 0.3 mm to the superior/ anterior margin. Remaining margins were >2 mm. Excision of the left breast scar showed skin changes consistent with recent surgical site.   The patient presented to Dr. Cathie HoopsYu on 04/06/17. Per her note, she discussed the patient's possible treatment options. Options included another resection, mastectomy, or radiation. The patient decided to proceed with radiation therapy. The  patient will start on Tamoxifen after radiation for 5 years. The patient currently denies lymphedema or pain at this time. She presents today to discuss the role of radiotherapy as part of her disease management.  PREVIOUS RADIATION THERAPY: No  PAST MEDICAL HISTORY:  has a past medical history of Anemia, Anxiety, DCIS (ductal carcinoma in situ), Depression, and Diabetes mellitus without complication (HCC).    PAST SURGICAL HISTORY: Past Surgical History:  Procedure Laterality Date  . BREAST LUMPECTOMY Left 02/24/2017   Procedure: BREAST LUMPECTOMY;  Surgeon: Nadeen LandauSmith, Jarvis Wilton, MD;  Location: [facility] ORS;  Service: General;  Laterality: Left;  Marland Kitchen. MASTECTOMY, PARTIAL Left 03/15/2017   Procedure: MASTECTOMY PARTIAL;  Surgeon: Nadeen LandauSmith, Jarvis Wilton, MD;  Location: [facility] ORS;  Service: General;  Laterality: Left;  . OVARIAN CYST REMOVAL      FAMILY HISTORY: family history includes Breast cancer (age of onset: 5457) in her mother; Diabetes in her father; Prostate cancer in her maternal grandfather. Mother passed from breast cancer at the age of 41.  SOCIAL HISTORY:  reports that  has never smoked. she has never used smokeless tobacco. She reports that she does not drink alcohol or use drugs.  ALLERGIES: Metformin and related  MEDICATIONS:  Current Outpatient Medications  Medication Sig Dispense Refill  . atorvastatin (LIPITOR) 20 MG tablet Take 20 mg by mouth at bedtime.    . Azelaic Acid (FINACEA EX) Apply 1 application topically at bedtime as needed.    . glyBURIDE (DIABETA) 5 MG tablet Take 5 mg by mouth 2 (two) times daily with a meal.    . losartan (COZAAR) 25 MG tablet Take 25 mg by  mouth every evening. PT STATES SHE MISSES DOSES OCC    . metFORMIN (GLUCOPHAGE) 1000 MG tablet Take 1,000 mg by mouth daily.    . sitaGLIPtin (JANUVIA) 100 MG tablet Take 100 mg by mouth daily.     No current facility-administered medications for this encounter.     REVIEW OF SYSTEMS:  A 10+ POINT REVIEW OF  SYSTEMS WAS OBTAINED including neurology, dermatology, psychiatry, cardiac, respiratory, lymph, extremities, GI, GU, musculoskeletal, constitutional, reproductive, HEENT. All pertinent positives are noted in the HPI. All others are negative.    PHYSICAL EXAM:  height is 5\' 8"  (1.727 m) and weight is 155 lb 12.8 oz (70.7 kg). Her oral temperature is 98.7 F (37.1 C). Her blood pressure is 121/78 and her pulse is 94. Her oxygen saturation is 100%.    General: Alert and oriented, in no acute distress HEENT: Head is normocephalic. Extraocular movements are intact. Oropharynx is clear. Neck: Neck is supple, no palpable cervical or supraclavicular lymphadenopathy. Heart: Regular in rate and rhythm with no murmurs, rubs, or gallops. Chest: Clear to auscultation bilaterally, with no rhonchi, wheezes, or rales. Abdomen: Soft, nontender, nondistended, with no rigidity or guarding. Extremities: No cyanosis or edema. Lymphatics: see Neck Exam Skin: No concerning lesions. Musculoskeletal: symmetric strength and muscle tone throughout. Neurologic: Cranial nerves II through XII are grossly intact. No obvious focalities. Speech is fluent. Coordination is intact. Psychiatric: Judgment and insight are intact. Affect is appropriate. Breast: Right breast no palpable mass or nipple discharge. Left breast the patient has a well healing scar in the periareolar area (UOQ) approximately 1.5 cm from the areolar border. No palpable mass within the breast, some mild swelling noted in the upper outer quadrant. No signs of infection in the breast.   ECOG = 1  0 - Asymptomatic (Fully active, able to carry on all predisease activities without restriction)  1 - Symptomatic but completely ambulatory (Restricted in physically strenuous activity but ambulatory and able to carry out work of a light or sedentary nature. For example, light housework, office work)  2 - Symptomatic, <50% in bed during the day (Ambulatory and  capable of all self care but unable to carry out any work activities. Up and about more than 50% of waking hours)  3 - Symptomatic, >50% in bed, but not bedbound (Capable of only limited self-care, confined to bed or chair 50% or more of waking hours)  4 - Bedbound (Completely disabled. Cannot carry on any self-care. Totally confined to bed or chair)  5 - Death   Santiago Glad MM, Creech RH, Tormey DC, et al. [phone]). "Toxicity and response criteria of the [facility]". Am. Evlyn Clines. Oncol. 5 (6): 649-55  LABORATORY DATA:  No results found for: WBC, HGB, HCT, MCV, PLT, NEUTROABS Lab Results  Component Value Date   NA 136 02/18/2017   K 4.8 02/18/2017   CL 108 02/18/2017   CO2 18 (L) 02/18/2017   GLUCOSE 214 (H) 02/18/2017   CREATININE 0.84 02/18/2017   CALCIUM 9.0 02/18/2017      RADIOGRAPHY: No results found.    IMPRESSION: DCIS of the left breast, nuclear grade II-III, ER+/PR+. The patient would be a good candidate for breast conservation with radiation therapy directed at the left breast. The patient has a close surgical margin and with patient's age and this close margin would not recommend surgery alone. I would recommend radiation therapy to the whole breast followed by boost to the lumpectomy cavity given the close surgical margin.  The patient previously discussed with her medical oncologist other options including re-excision and mastectomy, which the patient does not wish to consider. We discussed the course of treatment, side effects, and long term toxicities of radiotherapy. The patient does feel comfortable proceeding  with radiation therapy. We discussed the possible use of deep inspiration breath hold technique if heart issues come into play.  A consent form was signed and a copy was provided for the patient.    I offered the patient a referral for genetic counseling  given her young age and close family member diagnosed with breast cancer (mother diagnosed in  her late 61s with triple negative breast cancer and passing away within 6 months of diagnosis ). She declined the referral at this time, but she may consider this in the future.  PLAN: CT simulation and treatment planning will be scheduled approximately 4.5-5 weeks post-op. Anticipate treatment to begin 6 weeks post-op. I anticipate 6.5 weeks of treatment to the left breast.  I would not recommend hypofractionated accelerated treatment given her young age and breast contour.      ------------------------------------------------  Billie Lade, PhD, MD  This document serves as a record of services personally performed by Antony Blackbird, MD. It was created on his behalf by Tawni Levy, a trained medical scribe. The creation of this record is based on the scribe's personal observations and the provider's statements to them. This document has been checked and approved by the attending provider.

## 2017-04-19 ENCOUNTER — Telehealth: Payer: Self-pay | Admitting: Oncology

## 2017-04-19 NOTE — Telephone Encounter (Signed)
Called Janet Price back and advised her that Dr. Roselind MessierKinard will [facility] this with [facility]

## 2017-04-19 NOTE — Telephone Encounter (Addendum)
Patient called and said she would like to have a full body CT scan to make sure she doesn't have cancer in the rest of her body.  She is not sure who would order this.  Advised her that Dr. Roselind MessierKinard will be notified and that we will call her back.

## 2017-04-20 ENCOUNTER — Ambulatory Visit
Admission: RE | Admit: 2017-04-20 | Discharge: 2017-04-20 | Disposition: A | Discharge status: Home / Self Care | Payer: BLUE CROSS/BLUE SHIELD | Source: Ambulatory Visit | Attending: Radiation Oncology | Admitting: Radiation Oncology

## 2017-04-20 DIAGNOSIS — Z170 Estrogen receptor positive status [ER+]: Secondary | ICD-10-CM | POA: Diagnosis not present

## 2017-04-20 DIAGNOSIS — Z510 Encounter for antineoplastic radiation therapy: Secondary | ICD-10-CM | POA: Diagnosis not present

## 2017-04-20 DIAGNOSIS — D0512 Intraductal carcinoma in situ of left breast: Secondary | ICD-10-CM | POA: Insufficient documentation

## 2017-04-20 NOTE — Progress Notes (Signed)
Radiation Oncology         [phone] ________________________________  Name: Janet Price MRN: [id number]  Date: 04/20/2017  DOB: 03-12-1975  SIMULATION AND TREATMENT PLANNING NOTE    ICD-10-CM   1. Ductal carcinoma in situ (DCIS) of left breast D05.12     DIAGNOSIS:  DCIS of the left breast, nuclear grade II-III, ER+/PR+.  NARRATIVE:  The patient was brought to the CT Simulation planning suite.  Identity was confirmed.  All relevant records and images related to the planned course of therapy were reviewed.  The patient freely provided informed written consent to proceed with treatment after reviewing the details related to the planned course of therapy. The consent form was witnessed and verified by the simulation staff.  Then, the patient was set-up in a stable reproducible  supine position for radiation therapy.  CT images were obtained.  Surface markings were placed.  The CT images were loaded into the planning software.  Then the target and avoidance structures were contoured.  Treatment planning then occurred.  The radiation prescription was entered and confirmed.  Then, I designed and supervised the construction of a total of 3 medically necessary complex treatment devices.  I have requested : 3D Simulation  I have requested a DVH of the following structures: Heart, lungs, lumpectomy cavity.  I have ordered:CBC  PLAN:  The patient will receive 50.4 Gy in 28 fractions followed by a boost to the lumpectomy cavity of 12 gray in 6 fractions for a cumulative dose of 62.4 gray.     Optical Surface Tracking Plan:  Since intensity modulated radiotherapy (IMRT) and 3D conformal radiation treatment methods are predicated on accurate and precise positioning for treatment, intrafraction motion monitoring is medically necessary to ensure accurate and safe treatment delivery.  The ability to quantify intrafraction motion without excessive ionizing radiation dose can only be performed with  optical surface tracking. Accordingly, surface imaging offers the opportunity to obtain 3D measurements of patient position throughout IMRT and 3D treatments without excessive radiation exposure.  I am ordering optical surface tracking for this patient's upcoming course of radiotherapy. ________________________________   Special treatment procedure was performed today due to the extra time and effort required by myself to plan and prepare this patient for deep inspiration breath hold technique.  I have determined cardiac sparing to be of benefit to this patient to prevent long term cardiac damage due to radiation of the heart.  Bellows were placed on the patient's abdomen. To facilitate cardiac sparing, the patient was coached by the radiation therapists on breath hold techniques and breathing practice was performed. Practice waveforms were obtained. The patient was then scanned while maintaining breath hold in the treatment position.  This image was then transferred over to the imaging specialist. The imaging specialist then created a fusion of the free breathing and breath hold scans using the chest wall as the stable structure. I personally reviewed the fusion in axial, coronal and sagittal image planes.  Excellent cardiac sparing was obtained.  I felt the patient is an appropriate candidate for breath hold and the patient will be treated as such.  The image fusion was then reviewed with the patient to reinforce the necessity of reproducible breath hold.    -----------------------------------  Billie Lade, PhD, MD  This document serves as a record of services personally performed by Antony Blackbird MD. It was created on his behalf by Greer Ee, a trained medical scribe. The creation of this record is based on  the scribe's personal observations and the provider's statements to them.

## 2017-04-21 DIAGNOSIS — D0512 Intraductal carcinoma in situ of left breast: Secondary | ICD-10-CM | POA: Diagnosis not present

## 2017-04-27 ENCOUNTER — Ambulatory Visit
Admission: RE | Admit: 2017-04-27 | Discharge: 2017-04-27 | Disposition: A | Discharge status: Home / Self Care | Payer: BLUE CROSS/BLUE SHIELD | Source: Ambulatory Visit | Attending: Radiation Oncology | Admitting: Radiation Oncology

## 2017-04-27 DIAGNOSIS — D0512 Intraductal carcinoma in situ of left breast: Secondary | ICD-10-CM

## 2017-04-27 DIAGNOSIS — E119 Type 2 diabetes mellitus without complications: Secondary | ICD-10-CM

## 2017-04-27 NOTE — Progress Notes (Signed)
  Radiation Oncology         [phone] ________________________________  Name: Janet PlummerKonisa Sherece Odom MRN: [id number]  Date: 04/27/2017  DOB: Apr 08, 1975  Simulation Verification Note    ICD-10-CM   1. Type 2 diabetes mellitus without complication, without long-term current use of insulin (HCC) E11.9   2. Ductal carcinoma in situ (DCIS) of left breast D05.12     Status: outpatient  NARRATIVE: The patient was brought to the treatment unit and placed in the planned treatment position. The clinical setup was verified. Then port films were obtained and uploaded to the radiation oncology medical record software.  The treatment beams were carefully compared against the planned radiation fields. The position location and shape of the radiation fields was reviewed. They targeted volume of tissue appears to be appropriately covered by the radiation beams. Organs at risk appear to be excluded as planned.  Based on my personal review, I approved the simulation verification. The patient's treatment will proceed as planned.  -----------------------------------  Billie LadeJames D. Eryk Beavers, PhD, MD

## 2017-04-28 ENCOUNTER — Ambulatory Visit
Admission: RE | Admit: 2017-04-28 | Discharge: 2017-04-28 | Disposition: A | Discharge status: Home / Self Care | Payer: BLUE CROSS/BLUE SHIELD | Source: Ambulatory Visit | Attending: Radiation Oncology | Admitting: Radiation Oncology

## 2017-04-28 DIAGNOSIS — D0512 Intraductal carcinoma in situ of left breast: Secondary | ICD-10-CM | POA: Diagnosis not present

## 2017-04-28 MED ORDER — RADIAPLEXRX EX GEL
Freq: Two times a day (BID) | CUTANEOUS | Status: DC
  Administered 2017-04-28: 17:00:00 via TOPICAL

## 2017-04-28 MED ORDER — ALRA NON-METALLIC DEODORANT (RAD-ONC)
1.0000 "application " | Freq: Once | TOPICAL | Status: AC
  Administered 2017-04-28: 1 via TOPICAL

## 2017-04-28 NOTE — Progress Notes (Signed)

## 2017-04-29 ENCOUNTER — Ambulatory Visit
Admission: RE | Admit: 2017-04-29 | Discharge: 2017-04-29 | Disposition: A | Discharge status: Home / Self Care | Payer: BLUE CROSS/BLUE SHIELD | Source: Ambulatory Visit | Attending: Radiation Oncology | Admitting: Radiation Oncology

## 2017-04-29 DIAGNOSIS — D0512 Intraductal carcinoma in situ of left breast: Secondary | ICD-10-CM | POA: Diagnosis not present

## 2017-05-02 ENCOUNTER — Ambulatory Visit
Admission: RE | Admit: 2017-05-02 | Discharge: 2017-05-02 | Disposition: A | Discharge status: Home / Self Care | Payer: BLUE CROSS/BLUE SHIELD | Source: Ambulatory Visit | Attending: Radiation Oncology | Admitting: Radiation Oncology

## 2017-05-02 DIAGNOSIS — D0512 Intraductal carcinoma in situ of left breast: Secondary | ICD-10-CM | POA: Diagnosis not present

## 2017-05-03 ENCOUNTER — Ambulatory Visit
Admission: RE | Admit: 2017-05-03 | Discharge: 2017-05-03 | Disposition: A | Discharge status: Home / Self Care | Payer: BLUE CROSS/BLUE SHIELD | Source: Ambulatory Visit | Attending: Radiation Oncology | Admitting: Radiation Oncology

## 2017-05-03 DIAGNOSIS — D0512 Intraductal carcinoma in situ of left breast: Secondary | ICD-10-CM | POA: Diagnosis not present

## 2017-05-04 ENCOUNTER — Ambulatory Visit
Admission: RE | Admit: 2017-05-04 | Discharge: 2017-05-04 | Disposition: A | Discharge status: Home / Self Care | Payer: BLUE CROSS/BLUE SHIELD | Source: Ambulatory Visit | Attending: Radiation Oncology | Admitting: Radiation Oncology

## 2017-05-04 DIAGNOSIS — D0512 Intraductal carcinoma in situ of left breast: Secondary | ICD-10-CM | POA: Diagnosis not present

## 2017-05-05 ENCOUNTER — Ambulatory Visit
Admission: RE | Admit: 2017-05-05 | Discharge: 2017-05-05 | Disposition: A | Discharge status: Home / Self Care | Payer: BLUE CROSS/BLUE SHIELD | Source: Ambulatory Visit | Attending: Radiation Oncology | Admitting: Radiation Oncology

## 2017-05-05 DIAGNOSIS — D0512 Intraductal carcinoma in situ of left breast: Secondary | ICD-10-CM | POA: Diagnosis not present

## 2017-05-06 ENCOUNTER — Ambulatory Visit
Admission: RE | Admit: 2017-05-06 | Discharge: 2017-05-06 | Disposition: A | Discharge status: Home / Self Care | Payer: BLUE CROSS/BLUE SHIELD | Source: Ambulatory Visit | Attending: Radiation Oncology | Admitting: Radiation Oncology

## 2017-05-06 DIAGNOSIS — D0512 Intraductal carcinoma in situ of left breast: Secondary | ICD-10-CM | POA: Diagnosis not present

## 2017-05-09 ENCOUNTER — Ambulatory Visit
Admission: RE | Admit: 2017-05-09 | Discharge: 2017-05-09 | Disposition: A | Discharge status: Home / Self Care | Payer: BLUE CROSS/BLUE SHIELD | Source: Ambulatory Visit | Attending: Radiation Oncology | Admitting: Radiation Oncology

## 2017-05-09 DIAGNOSIS — Z510 Encounter for antineoplastic radiation therapy: Secondary | ICD-10-CM | POA: Insufficient documentation

## 2017-05-09 DIAGNOSIS — D0512 Intraductal carcinoma in situ of left breast: Secondary | ICD-10-CM | POA: Diagnosis not present

## 2017-05-10 ENCOUNTER — Ambulatory Visit
Admission: RE | Admit: 2017-05-10 | Discharge: 2017-05-10 | Disposition: A | Discharge status: Home / Self Care | Payer: BLUE CROSS/BLUE SHIELD | Source: Ambulatory Visit | Attending: Radiation Oncology | Admitting: Radiation Oncology

## 2017-05-10 DIAGNOSIS — D0512 Intraductal carcinoma in situ of left breast: Secondary | ICD-10-CM | POA: Diagnosis not present

## 2017-05-11 ENCOUNTER — Ambulatory Visit
Admission: RE | Admit: 2017-05-11 | Discharge: 2017-05-11 | Disposition: A | Discharge status: Home / Self Care | Payer: BLUE CROSS/BLUE SHIELD | Source: Ambulatory Visit | Attending: Radiation Oncology | Admitting: Radiation Oncology

## 2017-05-11 DIAGNOSIS — D0512 Intraductal carcinoma in situ of left breast: Secondary | ICD-10-CM | POA: Diagnosis not present

## 2017-05-12 ENCOUNTER — Ambulatory Visit
Admission: RE | Admit: 2017-05-12 | Discharge: 2017-05-12 | Disposition: A | Discharge status: Home / Self Care | Payer: BLUE CROSS/BLUE SHIELD | Source: Ambulatory Visit | Attending: Radiation Oncology | Admitting: Radiation Oncology

## 2017-05-12 DIAGNOSIS — D0512 Intraductal carcinoma in situ of left breast: Secondary | ICD-10-CM | POA: Diagnosis not present

## 2017-05-13 ENCOUNTER — Ambulatory Visit
Admission: RE | Admit: 2017-05-13 | Discharge: 2017-05-13 | Disposition: A | Discharge status: Home / Self Care | Payer: BLUE CROSS/BLUE SHIELD | Source: Ambulatory Visit | Attending: Radiation Oncology | Admitting: Radiation Oncology

## 2017-05-13 DIAGNOSIS — D0512 Intraductal carcinoma in situ of left breast: Secondary | ICD-10-CM | POA: Diagnosis not present

## 2017-05-16 ENCOUNTER — Ambulatory Visit
Admission: RE | Admit: 2017-05-16 | Discharge: 2017-05-16 | Disposition: A | Discharge status: Home / Self Care | Payer: BLUE CROSS/BLUE SHIELD | Source: Ambulatory Visit | Attending: Radiation Oncology | Admitting: Radiation Oncology

## 2017-05-16 DIAGNOSIS — D0512 Intraductal carcinoma in situ of left breast: Secondary | ICD-10-CM | POA: Diagnosis not present

## 2017-05-17 ENCOUNTER — Ambulatory Visit
Admission: RE | Admit: 2017-05-17 | Discharge: 2017-05-17 | Disposition: A | Discharge status: Home / Self Care | Payer: BLUE CROSS/BLUE SHIELD | Source: Ambulatory Visit | Attending: Radiation Oncology | Admitting: Radiation Oncology

## 2017-05-17 DIAGNOSIS — D0512 Intraductal carcinoma in situ of left breast: Secondary | ICD-10-CM | POA: Diagnosis not present

## 2017-05-18 ENCOUNTER — Telehealth: Payer: Self-pay | Admitting: *Deleted

## 2017-05-18 ENCOUNTER — Ambulatory Visit
Admission: RE | Admit: 2017-05-18 | Discharge: 2017-05-18 | Disposition: A | Discharge status: Home / Self Care | Payer: BLUE CROSS/BLUE SHIELD | Source: Ambulatory Visit | Attending: Radiation Oncology | Admitting: Radiation Oncology

## 2017-05-18 DIAGNOSIS — D0512 Intraductal carcinoma in situ of left breast: Secondary | ICD-10-CM | POA: Diagnosis not present

## 2017-05-19 ENCOUNTER — Ambulatory Visit
Admission: RE | Admit: 2017-05-19 | Discharge: 2017-05-19 | Disposition: A | Discharge status: Home / Self Care | Payer: BLUE CROSS/BLUE SHIELD | Source: Ambulatory Visit | Attending: Radiation Oncology | Admitting: Radiation Oncology

## 2017-05-19 DIAGNOSIS — D0512 Intraductal carcinoma in situ of left breast: Secondary | ICD-10-CM | POA: Diagnosis not present

## 2017-05-20 ENCOUNTER — Ambulatory Visit
Admission: RE | Admit: 2017-05-20 | Discharge: 2017-05-20 | Disposition: A | Discharge status: Home / Self Care | Payer: BLUE CROSS/BLUE SHIELD | Source: Ambulatory Visit | Attending: Radiation Oncology | Admitting: Radiation Oncology

## 2017-05-20 DIAGNOSIS — D0512 Intraductal carcinoma in situ of left breast: Secondary | ICD-10-CM | POA: Diagnosis not present

## 2017-05-23 ENCOUNTER — Telehealth: Payer: Self-pay | Admitting: Radiation Oncology

## 2017-05-23 ENCOUNTER — Ambulatory Visit
Admission: RE | Admit: 2017-05-23 | Discharge: 2017-05-23 | Disposition: A | Discharge status: Home / Self Care | Payer: BLUE CROSS/BLUE SHIELD | Source: Ambulatory Visit | Attending: Radiation Oncology | Admitting: Radiation Oncology

## 2017-05-23 DIAGNOSIS — D0512 Intraductal carcinoma in situ of left breast: Secondary | ICD-10-CM | POA: Diagnosis not present

## 2017-05-23 NOTE — Telephone Encounter (Signed)
Spoke with patient confirming FMLA paperwork has been successfully faxed to [employer] to [employer] at [phone]. [facility].

## 2017-05-24 ENCOUNTER — Ambulatory Visit
Admission: RE | Admit: 2017-05-24 | Discharge: 2017-05-24 | Disposition: A | Discharge status: Home / Self Care | Payer: BLUE CROSS/BLUE SHIELD | Source: Ambulatory Visit | Attending: Radiation Oncology | Admitting: Radiation Oncology

## 2017-05-24 DIAGNOSIS — D0512 Intraductal carcinoma in situ of left breast: Secondary | ICD-10-CM | POA: Diagnosis not present

## 2017-05-25 ENCOUNTER — Ambulatory Visit
Admission: RE | Admit: 2017-05-25 | Discharge: 2017-05-25 | Disposition: A | Discharge status: Home / Self Care | Payer: BLUE CROSS/BLUE SHIELD | Source: Ambulatory Visit | Attending: Radiation Oncology | Admitting: Radiation Oncology

## 2017-05-25 DIAGNOSIS — D0512 Intraductal carcinoma in situ of left breast: Secondary | ICD-10-CM | POA: Diagnosis not present

## 2017-05-26 ENCOUNTER — Ambulatory Visit
Admission: RE | Admit: 2017-05-26 | Discharge: 2017-05-26 | Disposition: A | Discharge status: Home / Self Care | Payer: BLUE CROSS/BLUE SHIELD | Source: Ambulatory Visit | Attending: Radiation Oncology | Admitting: Radiation Oncology

## 2017-05-26 DIAGNOSIS — D0512 Intraductal carcinoma in situ of left breast: Secondary | ICD-10-CM | POA: Diagnosis not present

## 2017-05-27 ENCOUNTER — Ambulatory Visit
Admission: RE | Admit: 2017-05-27 | Discharge: 2017-05-27 | Disposition: A | Discharge status: Home / Self Care | Payer: BLUE CROSS/BLUE SHIELD | Source: Ambulatory Visit | Attending: Radiation Oncology | Admitting: Radiation Oncology

## 2017-05-27 DIAGNOSIS — D0512 Intraductal carcinoma in situ of left breast: Secondary | ICD-10-CM | POA: Diagnosis not present

## 2017-05-30 ENCOUNTER — Ambulatory Visit
Admission: RE | Admit: 2017-05-30 | Discharge: 2017-05-30 | Disposition: A | Discharge status: Home / Self Care | Payer: BLUE CROSS/BLUE SHIELD | Source: Ambulatory Visit | Attending: Radiation Oncology | Admitting: Radiation Oncology

## 2017-05-30 DIAGNOSIS — D0512 Intraductal carcinoma in situ of left breast: Secondary | ICD-10-CM | POA: Diagnosis not present

## 2017-05-31 ENCOUNTER — Ambulatory Visit
Admission: RE | Admit: 2017-05-31 | Discharge: 2017-05-31 | Disposition: A | Discharge status: Home / Self Care | Payer: BLUE CROSS/BLUE SHIELD | Source: Ambulatory Visit | Attending: Radiation Oncology | Admitting: Radiation Oncology

## 2017-05-31 DIAGNOSIS — D0512 Intraductal carcinoma in situ of left breast: Secondary | ICD-10-CM

## 2017-05-31 MED ORDER — SONAFINE EX EMUL
1.0000 "application " | Freq: Every day | CUTANEOUS | Status: DC | PRN
  Administered 2017-05-31: 1 via TOPICAL

## 2017-06-01 ENCOUNTER — Ambulatory Visit
Admission: RE | Admit: 2017-06-01 | Discharge: 2017-06-01 | Disposition: A | Discharge status: Home / Self Care | Payer: BLUE CROSS/BLUE SHIELD | Source: Ambulatory Visit | Attending: Radiation Oncology | Admitting: Radiation Oncology

## 2017-06-01 DIAGNOSIS — D0512 Intraductal carcinoma in situ of left breast: Secondary | ICD-10-CM | POA: Diagnosis not present

## 2017-06-02 ENCOUNTER — Ambulatory Visit
Admission: RE | Admit: 2017-06-02 | Discharge: 2017-06-02 | Disposition: A | Discharge status: Home / Self Care | Payer: BLUE CROSS/BLUE SHIELD | Source: Ambulatory Visit | Attending: Radiation Oncology | Admitting: Radiation Oncology

## 2017-06-02 DIAGNOSIS — D0512 Intraductal carcinoma in situ of left breast: Secondary | ICD-10-CM | POA: Diagnosis not present

## 2017-06-03 ENCOUNTER — Ambulatory Visit
Admission: RE | Admit: 2017-06-03 | Discharge: 2017-06-03 | Disposition: A | Discharge status: Home / Self Care | Payer: BLUE CROSS/BLUE SHIELD | Source: Ambulatory Visit | Attending: Radiation Oncology | Admitting: Radiation Oncology

## 2017-06-03 DIAGNOSIS — D0512 Intraductal carcinoma in situ of left breast: Secondary | ICD-10-CM | POA: Diagnosis not present

## 2017-06-06 ENCOUNTER — Ambulatory Visit
Admission: RE | Admit: 2017-06-06 | Discharge: 2017-06-06 | Disposition: A | Discharge status: Home / Self Care | Payer: BLUE CROSS/BLUE SHIELD | Source: Ambulatory Visit | Attending: Radiation Oncology | Admitting: Radiation Oncology

## 2017-06-06 DIAGNOSIS — D0512 Intraductal carcinoma in situ of left breast: Secondary | ICD-10-CM | POA: Diagnosis not present

## 2017-06-06 NOTE — Progress Notes (Signed)
Simulation verification  The patient was brought to the treatment machine and placed in the plan treatment position.  Clinical set up was verified to ensure that the target region is appropriately covered for the patient's upcoming electron boost treatment.  The targeted volume of tissue is appropriately covered by the radiation field.  Based on my personal review, I approve the simulation verification.  The patient's treatment will proceed as planned.  ------------------------------------------------   

## 2017-06-07 ENCOUNTER — Ambulatory Visit
Admission: RE | Admit: 2017-06-07 | Discharge: 2017-06-07 | Disposition: A | Discharge status: Home / Self Care | Payer: BLUE CROSS/BLUE SHIELD | Source: Ambulatory Visit | Attending: Radiation Oncology | Admitting: Radiation Oncology

## 2017-06-07 ENCOUNTER — Encounter: Payer: Self-pay | Admitting: Radiation Oncology

## 2017-06-07 DIAGNOSIS — D0512 Intraductal carcinoma in situ of left breast: Secondary | ICD-10-CM | POA: Diagnosis not present

## 2017-06-08 ENCOUNTER — Ambulatory Visit: Payer: BLUE CROSS/BLUE SHIELD

## 2017-06-09 ENCOUNTER — Ambulatory Visit: Payer: BLUE CROSS/BLUE SHIELD

## 2017-06-10 ENCOUNTER — Ambulatory Visit: Payer: BLUE CROSS/BLUE SHIELD

## 2017-06-13 ENCOUNTER — Ambulatory Visit: Payer: BLUE CROSS/BLUE SHIELD

## 2017-06-13 NOTE — Progress Notes (Signed)
  Radiation Oncology         [phone]) [phone] ________________________________  Name: Janet Price MRN: [id number]  Date: 06/07/2017  DOB: 12-10-1975  End of Treatment Note  Diagnosis:   42 y.o. female with DCIS of the left breast, nuclear grade II-III, ER+/PR+     Indication for treatment:  Curative       Radiation treatment dates:   04/28/2017 - 06/07/2017  Site/Planned dose:    1. Left Breast / 50.4 Gy in 28 fractions 2. Boost / 12 Gy in 6 fractions  The patient completed her initial 28 treatments and 2 out of 6 boost treatments to yield a final total dose of 54.4 Gy.   Beams/energy:    1. 3D / 6X, 10X Photon 2. 12 MeV Electron  Narrative: The patient tolerated radiation treatment relatively well.  She experienced mild fatigue and some radiation-related skin changes. Her left breast area showed hyperpigmentation and mild dermatitis, but no skin breakdown was noted. She also reported left arm stiffness and soreness under her left arm and in the breast area.    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 She was able complete 2 boost treatments on April 30 with a 6 hour separation between the 2 boost fields.  Plan: The patient has completed radiation treatment. The patient will return to radiation oncology clinic for routine followup in one month. I advised them to call or return sooner if they have any questions or concerns related to their recovery or treatment.  -----------------------------------  Billie Lade, PhD, MD  This document serves as a record of services personally performed by Antony Blackbird, MD. It was created on his behalf by Ivar Bury, a trained medical scribe. The creation of this record is based on the scribe's personal observations and the provider's statements to them. This document has been checked and approved by the attending provider.

## 2017-06-14 ENCOUNTER — Ambulatory Visit: Payer: BLUE CROSS/BLUE SHIELD

## 2017-06-20 ENCOUNTER — Encounter: Payer: Self-pay | Admitting: *Deleted

## 2017-06-20 NOTE — Progress Notes (Signed)
  Oncology Nurse Navigator Documentation  Navigator Location: [facility] (06/20/17 1000)   )Navigator Encounter Type: Telephone (06/20/17 1000) Telephone: Outgoing Call (06/20/17 1000)                                                  Time Spent with Patient: 15 (06/20/17 1000)   Left patient a message to return my call.  Dr. Cathie Hoops would like me to schedule her a post radiation follow-up visit.

## 2017-06-20 NOTE — Progress Notes (Signed)
Called patient again.  She states she got my message, and the schedulers message.  States she cannot keep the appointment scheduled for Monday, May 20th with Dr. Cathie Hoops.  Offered to reschedule the appointment for her.  States I will call you back.  States she is very depressed.  Offered free counseling, but she states "not now, but I will want to later."  Patient is to call me back.  I will check back with her in one week if no return call.

## 2017-06-27 ENCOUNTER — Inpatient Hospital Stay: Payer: BLUE CROSS/BLUE SHIELD | Admitting: Oncology

## 2017-06-30 ENCOUNTER — Encounter: Payer: Self-pay | Admitting: *Deleted

## 2017-07-11 ENCOUNTER — Ambulatory Visit: Payer: BLUE CROSS/BLUE SHIELD | Admitting: Radiation Oncology

## 2017-07-14 ENCOUNTER — Encounter: Payer: Self-pay | Admitting: Radiation Oncology

## 2017-07-14 ENCOUNTER — Other Ambulatory Visit: Payer: Self-pay

## 2017-07-14 ENCOUNTER — Ambulatory Visit
Admission: RE | Admit: 2017-07-14 | Discharge: 2017-07-14 | Disposition: A | Discharge status: Home / Self Care | Payer: BLUE CROSS/BLUE SHIELD | Source: Ambulatory Visit | Attending: Radiation Oncology | Admitting: Radiation Oncology

## 2017-07-14 VITALS — BP 117/78 | HR 96 | Temp 98.9°F | Resp 18 | Ht 68.0 in | Wt 153.6 lb

## 2017-07-14 DIAGNOSIS — R5383 Other fatigue: Secondary | ICD-10-CM | POA: Insufficient documentation

## 2017-07-14 DIAGNOSIS — D0512 Intraductal carcinoma in situ of left breast: Secondary | ICD-10-CM

## 2017-07-14 DIAGNOSIS — R413 Other amnesia: Secondary | ICD-10-CM | POA: Insufficient documentation

## 2017-07-14 DIAGNOSIS — Z923 Personal history of irradiation: Secondary | ICD-10-CM | POA: Insufficient documentation

## 2017-07-14 DIAGNOSIS — Z79899 Other long term (current) drug therapy: Secondary | ICD-10-CM | POA: Diagnosis not present

## 2017-07-14 DIAGNOSIS — Z170 Estrogen receptor positive status [ER+]: Secondary | ICD-10-CM | POA: Diagnosis not present

## 2017-07-14 DIAGNOSIS — Z7984 Long term (current) use of oral hypoglycemic drugs: Secondary | ICD-10-CM | POA: Insufficient documentation

## 2017-07-14 NOTE — Progress Notes (Signed)
Radiation Oncology         [phone]) [phone] ________________________________  Name: Wanetta Funderburke MRN: [id number]  Date: 07/14/2017  DOB: 11-15-1975  Follow-Up Visit Note  CC: Whitaker, Jonnie Finner, PA-C  Rickard Patience, MD    ICD-10-CM   1. Ductal carcinoma in situ (DCIS) of left breast D05.12 Ambulatory Referral to Genetics    Diagnosis:   42 y.o. female with DCIS of the left breast, nuclear grade II-III, ER+/PR+  Interval Since Last Radiation: 1 month and 7 days, 04/28/2017 - 06/07/2017  Narrative:  The patient returns today for routine follow-up. She is doing well overall. She reports she occasionally sleeps during the day, noting specifically a day last week where she slept all afternoon. She doesn't feel comfortable going back to work full-time due to the fatigue. She noted a lapse of memory pertaining to her debit card pin number. She requested a doctor's note for her job.     On review of systems, patient notes fatigue and excess sleep. She notes tingling, but denies shooting pain. Patient denies nipple discharge or bleeding, arm pain, and any other symptoms.   ALLERGIES:  is allergic to metformin and related.  Meds: Current Outpatient Medications  Medication Sig Dispense Refill  . atorvastatin (LIPITOR) 20 MG tablet Take 20 mg by mouth at bedtime.    . Azelaic Acid (FINACEA EX) Apply 1 application topically at bedtime as needed.    . glyBURIDE (DIABETA) 5 MG tablet Take 5 mg by mouth 2 (two) times daily with a meal.    . losartan (COZAAR) 25 MG tablet Take 25 mg by mouth every evening. PT STATES SHE MISSES DOSES OCC    . metFORMIN (GLUCOPHAGE) 1000 MG tablet Take 1,000 mg by mouth daily.    . sitaGLIPtin (JANUVIA) 100 MG tablet Take 100 mg by mouth daily.     No current facility-administered medications for this encounter.     Physical Findings:  The patient is in no acute distress. Patient is alert and oriented.  height is 5\' 8"  (1.727 m) and weight is 153 lb 9.6 oz  (69.7 kg). Her oral temperature is 98.9 F (37.2 C). Her blood pressure is 117/78 and her pulse is 96. Her respiration is 18 and oxygen saturation is 100%. .   Lungs are clear to auscultation bilaterally. Heart has regular rate and rhythm. No palpable cervical, supraclavicular, or axillary adenopathy. Abdomen soft, non-tender, normal bowel sounds. Left breast: Pt's skin has healed well, with some residual edema and hyperpigmentation of the breast. Some expected enduration near the lumpectomy scar. No palpable mass, no nipple discharge or bleeding. Right breast: no palpable mass,  No nipple discharge  Lab Findings: No results found for: WBC, HGB, HCT, MCV, PLT  Radiographic Findings: No results found.  Impression:  The patient is recovering from the effects of radiation. No evidence of recurrence on physical exam. Pt continues to have significant fatigue, but feels she would be ready to resume full-time work on July 1st.   Plan:  Routine follow-up in 3 months. I discussed the initiation of tamoxifen and recommended she follow up with her oncologist in [place]. At this time, she seems more willing to begin this medication. She is also willing to pursue genetic counseling with her young age at diagnosis and family history. Pt has agreed to this evaluation and will be scheduled for genetic counseling in the near future. Pt will have a note forwarded to her HR at [email or web address] to resume full-time work  on July 1st.  -----------------------------------  Billie LadeJames D. Macarena Langseth, PhD, MD  This document serves as a record of services personally performed by Antony BlackbirdJames Cylis Ayars, MD. It was created on his behalf by Mickie BailKatie Daubenspeck, a trained medical scribe. The creation of this record is based on the scribe's personal observations and the provider's statements to them. This document has been checked and approved by the attending provider.

## 2017-07-14 NOTE — Progress Notes (Signed)
Pt here for a 1 month follow-up for left breast cancer. Pt states that she has some tingling in her left breast and it feels sensitive when touched. Pt states that she has moderate fatigue and is sleeping to help. Pt states that sometimes she sleeps all day. Pt states that she still has darkening on her breast but it is lighter than it was before. Pt states that she is using shea butter every night.  BP 117/78 (BP Location: Right Arm, Patient Position: Sitting, Cuff Size: Normal)   Pulse 96   Temp 98.9 F (37.2 C) (Oral)   Resp 18   Ht 5\' 8"  (1.727 m)   Wt 153 lb 9.6 oz (69.7 kg)   SpO2 100%   BMI 23.35 kg/m    Wt Readings from Last 3 Encounters:  07/14/17 153 lb 9.6 oz (69.7 kg)  04/14/17 155 lb 12.8 oz (70.7 kg)  04/06/17 154 lb (69.9 kg)

## 2017-07-18 ENCOUNTER — Encounter: Payer: Self-pay | Admitting: *Deleted

## 2017-07-18 NOTE — Progress Notes (Signed)
Received message from Dr. Cathie HoopsYu today to schedule patient a follow up appointment with her.  Called patient today.  States she is moving to [place] and will start a new job.  States she will only have insurance until the end of next week, and will not have insurance again until August.  Explained we could try and work her in by next week.  Then she stated she probably could not afford her new prescription.  Informed her that we could look in to getting medication assistance.  Offered to get her scheduled.  States she could not stay on the phone.  Will send a request to one of the schedulers to try and get her scheduled by the end of next week.

## 2017-07-19 ENCOUNTER — Telehealth: Payer: Self-pay | Admitting: Oncology

## 2017-07-19 NOTE — Telephone Encounter (Signed)
2nd Attempt to contact patient about scheduling follow-up with Dr. Cathie HoopsYu per scheduling message from Molli PoseySheena Lambert. LVM to contact office to get scheduled

## 2017-10-18 ENCOUNTER — Telehealth: Payer: Self-pay

## 2017-10-18 NOTE — Telephone Encounter (Signed)
Returned pt's VM. This RN conveyed to pt that missed call from this office was most likely auto-call to confirm pt's upcoming appt with Dr. Roselind Messier, 10/20/17 at 0945. Pt stated that she now lives in [place] and will check with her boss to see if she can attend her upcoming appt. Encouraged pt to call as soon as able. Pt verbalized understanding and agreement. Delight Stare, RN BSN

## 2017-10-20 ENCOUNTER — Ambulatory Visit
Admission: RE | Admit: 2017-10-20 | Payer: BLUE CROSS/BLUE SHIELD | Source: Ambulatory Visit | Admitting: Radiation Oncology

## 2017-10-20 ENCOUNTER — Ambulatory Visit
Admission: RE | Admit: 2017-10-20 | Discharge: 2017-10-20 | Disposition: A | Discharge status: Home / Self Care | Payer: BLUE CROSS/BLUE SHIELD | Source: Ambulatory Visit | Attending: Radiation Oncology | Admitting: Radiation Oncology

## 2018-03-30 IMAGING — MG US BREAST BX W LOC DEV 1ST LESION IMG BX SPEC US GUIDE*L*
1 series · 8 of 8 positions shown · non-contrast
Comparison: Previous exam(s).

ADDENDUM:
Pathology of the left breast biopsy revealed A. LEFT BREAST, [DATE], 3
CMFN; BIOPSY: ATYPICAL INTRADUCTAL PAPILLARY PROLIFERATION, SEE
COMMENT. Comment: The differential diagnosis includes intraductal
papilloma with DCIS and intraductal papillary carcinoma. Definitive
classification is deferred to the excision specimen. The diagnosis
was called to [facility]odon Paisc[facility] of [facility] on
12/718 at 318 PM.

This was found to be concordant by Dr. Moritz.
Recommendation:  Surgical excision.
Results and recommendations were relayed to Carberry, Akilah
nurse [place] by phone by Cabada, Jeampiere on 01/14/17. She will
discuss results with him and contact Cabada, Jeampiere regarding
giving results to the patient and making the referral. Attempts were
made to contact the patient for a post biopsy check by Malee, Yanling but were unsuccessful. Ashima Marchant did speak with Aises, Ijaloo
Fokir, [place] nurse on 01/20/17 to confirm results had been given
to the patient. She stated they have contacted the patient with the
results and recommendations.
Addendum by Cabada, Jeampiere on 01/20/17.
CLINICAL DATA: 41-year-old female presenting for ultrasound-guided
biopsy of an ill-defined hypoechoic mass in the left breast.
EXAM:
ULTRASOUND GUIDED LEFT BREAST CORE NEEDLE BIOPSY

[Series 1: MG view · 0.06mm/px · 8 of 9 slices shown]
[im 1/9]
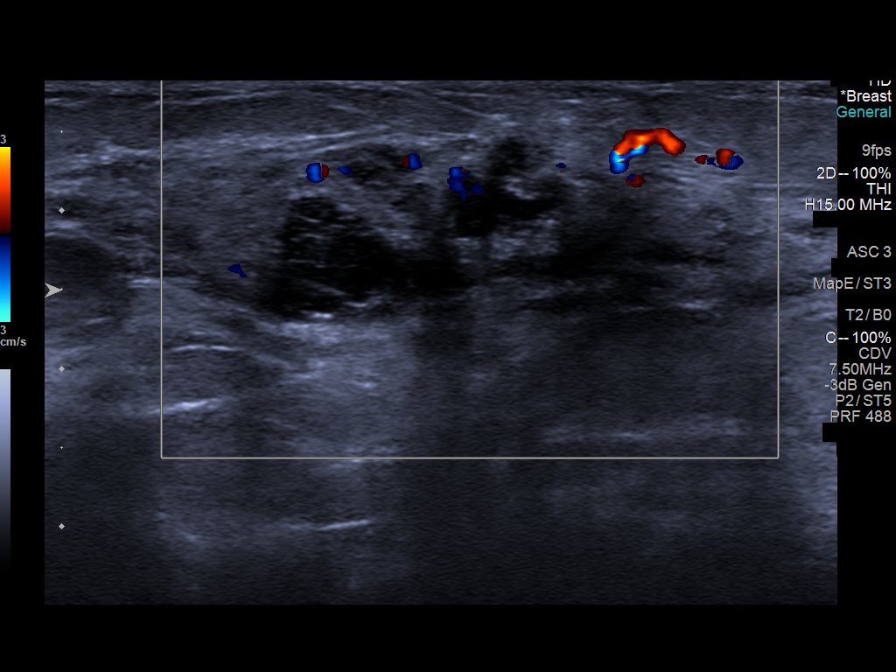
[im 2/9]
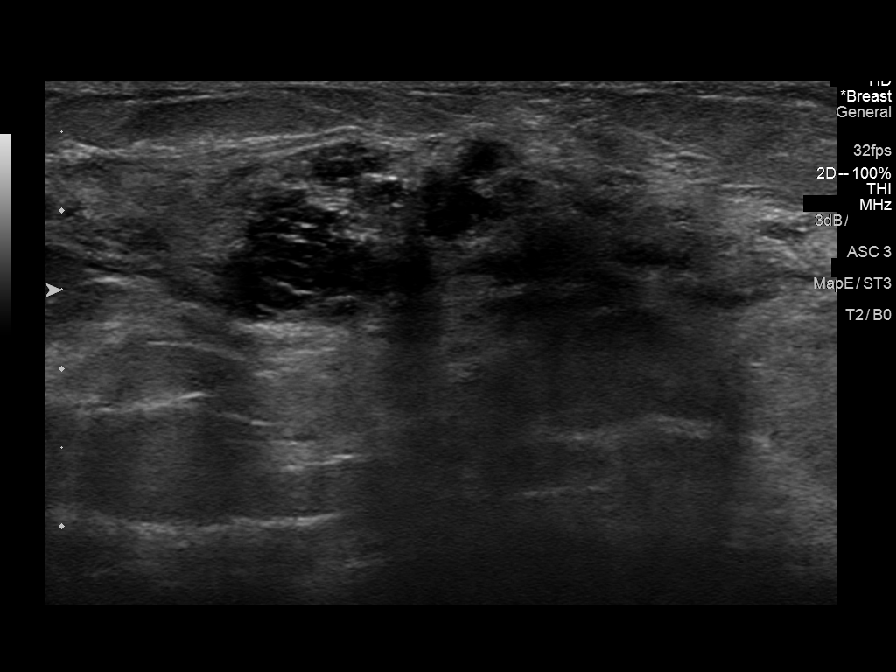
[im 3/9]
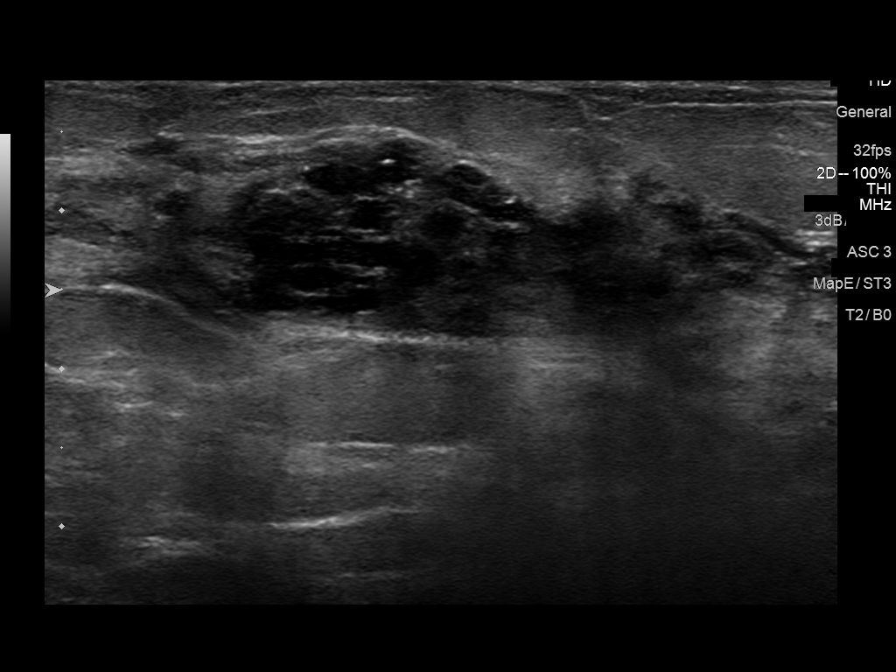
[im 4/9]
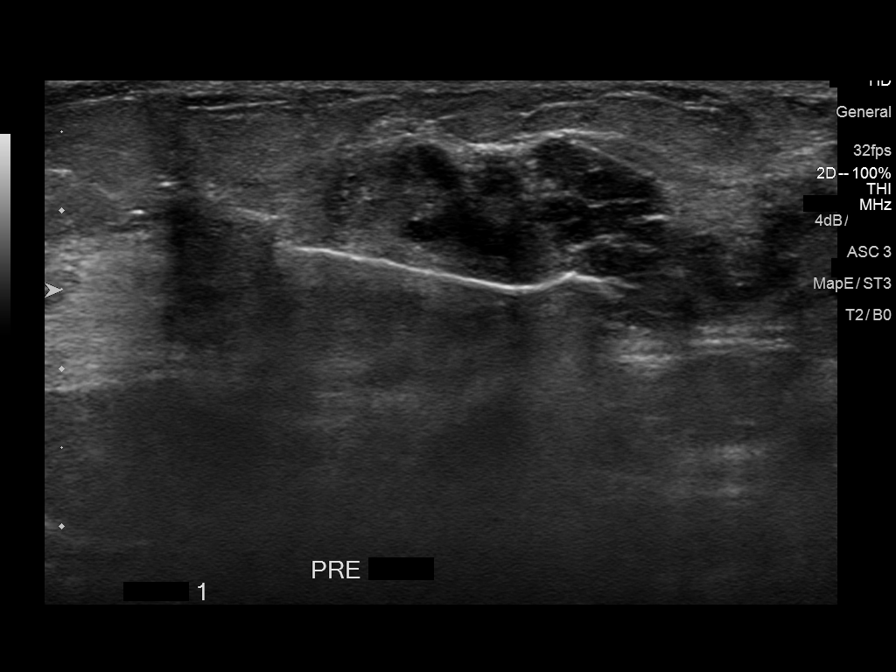
[im 5/9]
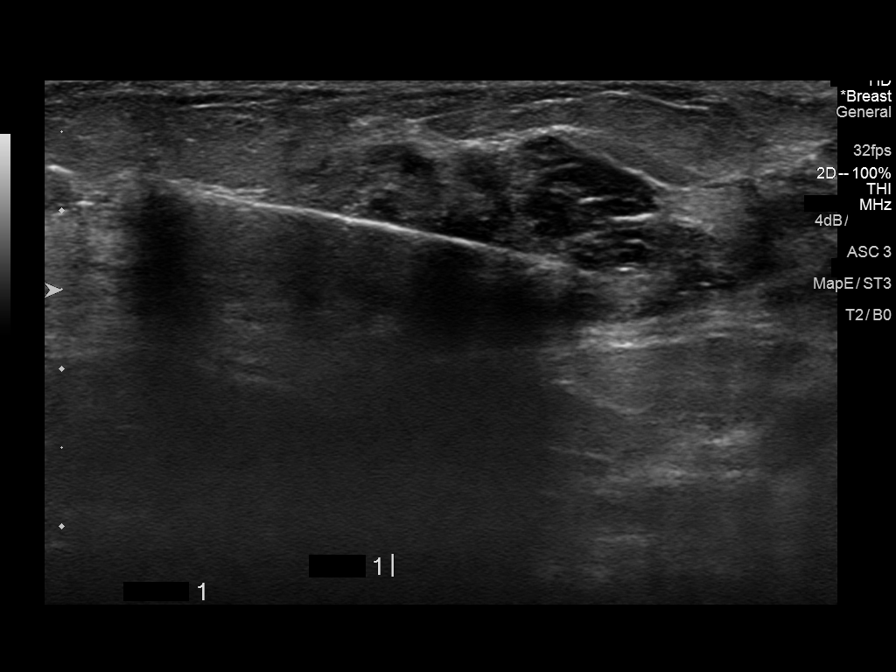
[im 6/9]
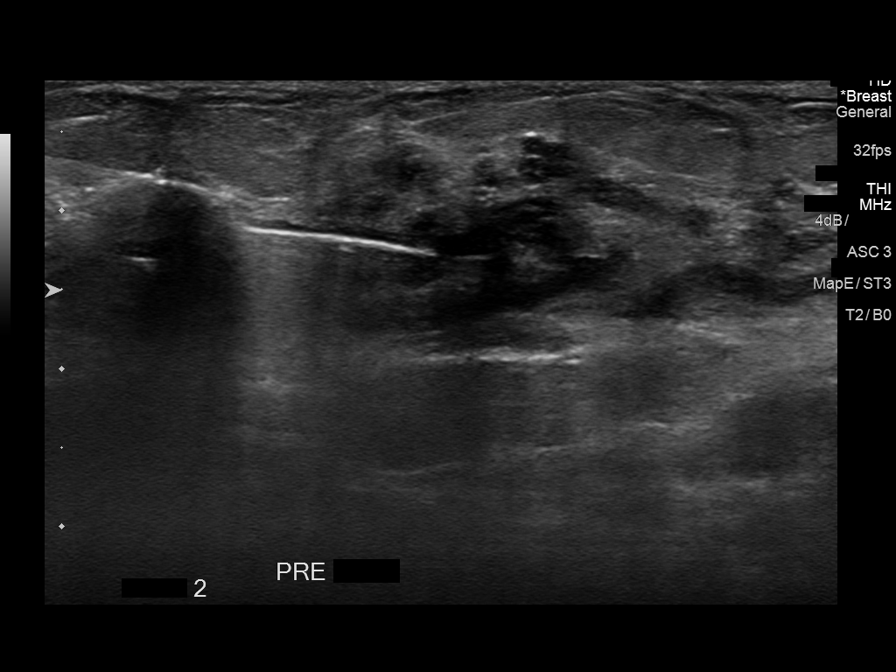
[im 7/9]
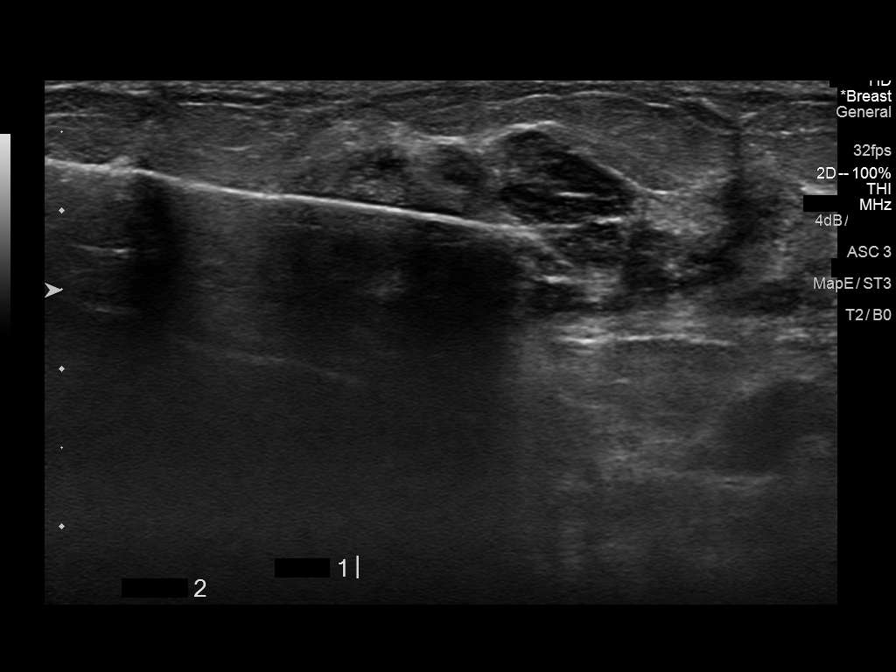
[im 9/9]
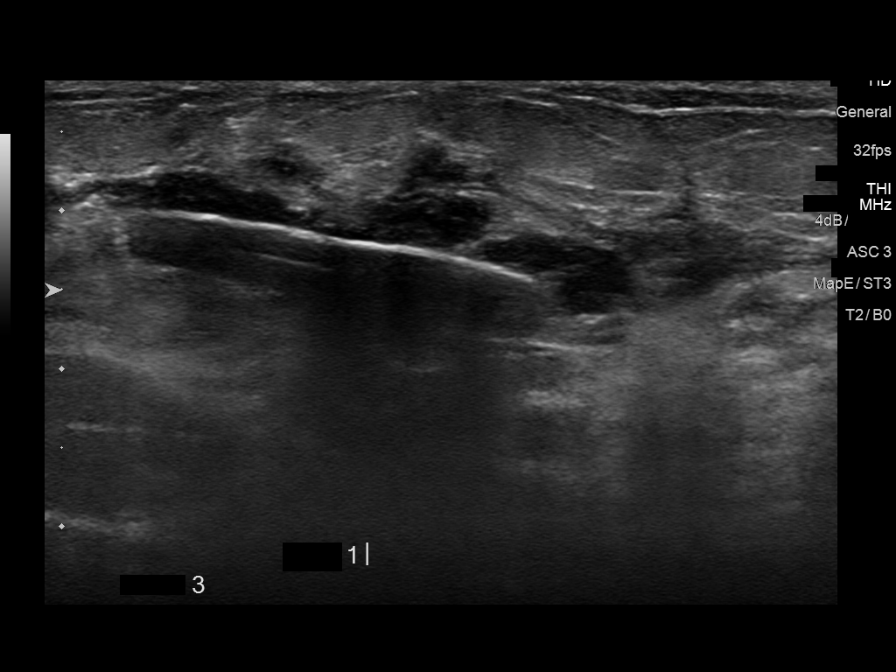

[8 of 8 positions shown; findings below may reference images not displayed]



Lesion quadrant: Upper-outer quadrant.

Using sterile technique and 1% Lidocaine as local anesthetic, under
direct ultrasound visualization, a 12 gauge Faketa device was
used to perform biopsy of a left breast mass using a lateral
approach. The patient declined having a post biopsy clip placed,
despite a long conversation regarding the risks and benefits of clip
placement.
IMPRESSION: Ultrasound guided biopsy of an indeterminate left breast mass. No
apparent complications. Of note, the patient declined having a post
biopsy clip placed.

## 2019-10-25 IMAGING — MG MM DIGITAL DIAGNOSTIC BILAT W/ TOMO W/ CAD
8 of 15 series · 8 of 35 positions shown · non-contrast
Comparison: Previous exam(s).

CLINICAL DATA: Left breast upper outer quadrant area of palpable
concern felt by the patient. History of breast cancer in mother, who
was diagnosed with breast cancer in her 50s.

EXAM:
2D DIGITAL DIAGNOSTIC BILATERAL MAMMOGRAM WITH CAD AND ADJUNCT TOMO
ULTRASOUND LEFT BREAST

[L CC synth-2D]
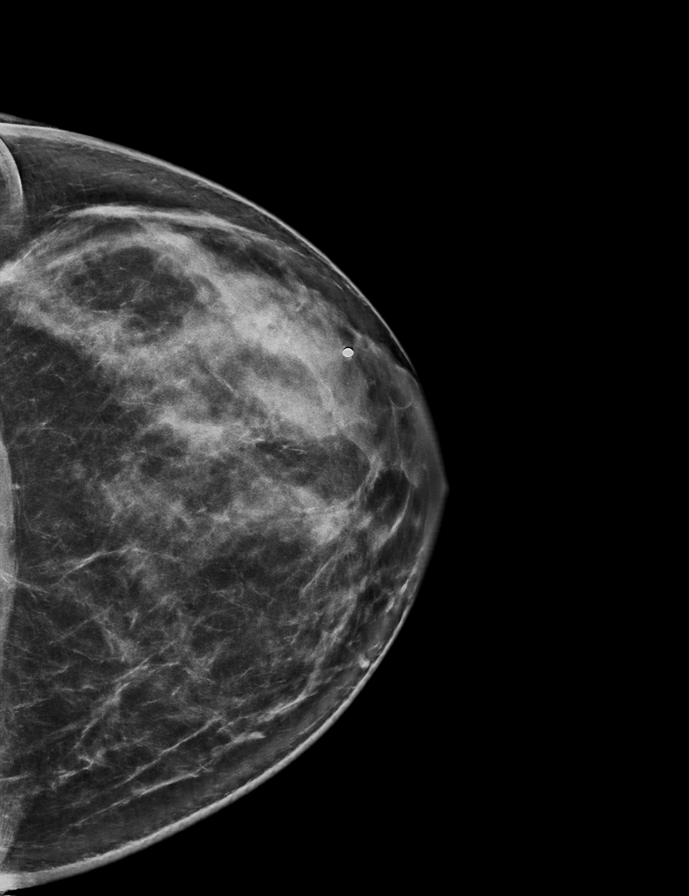

[L CC]
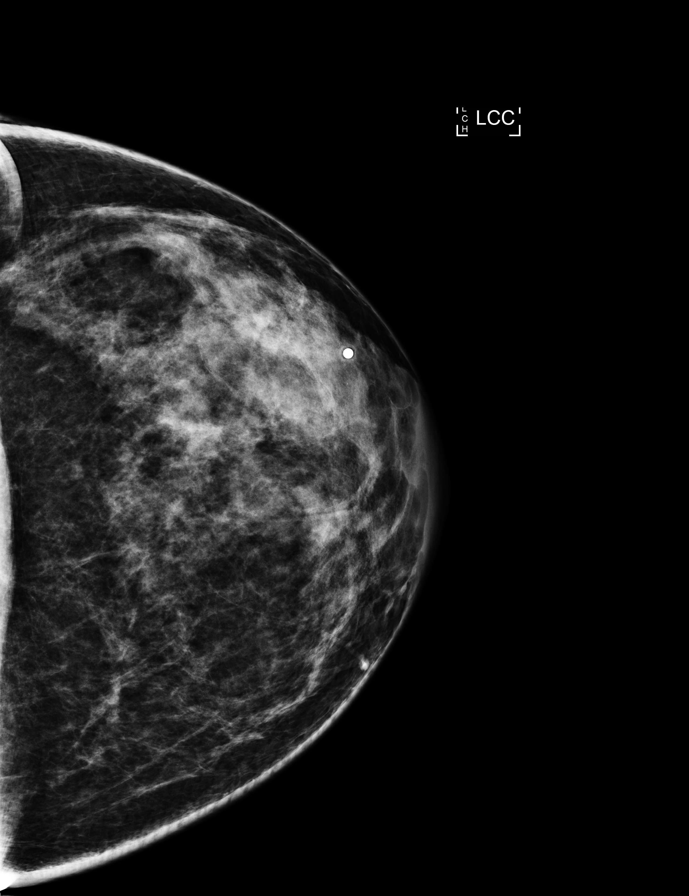

[L MLO synth-2D (1 of 2)]
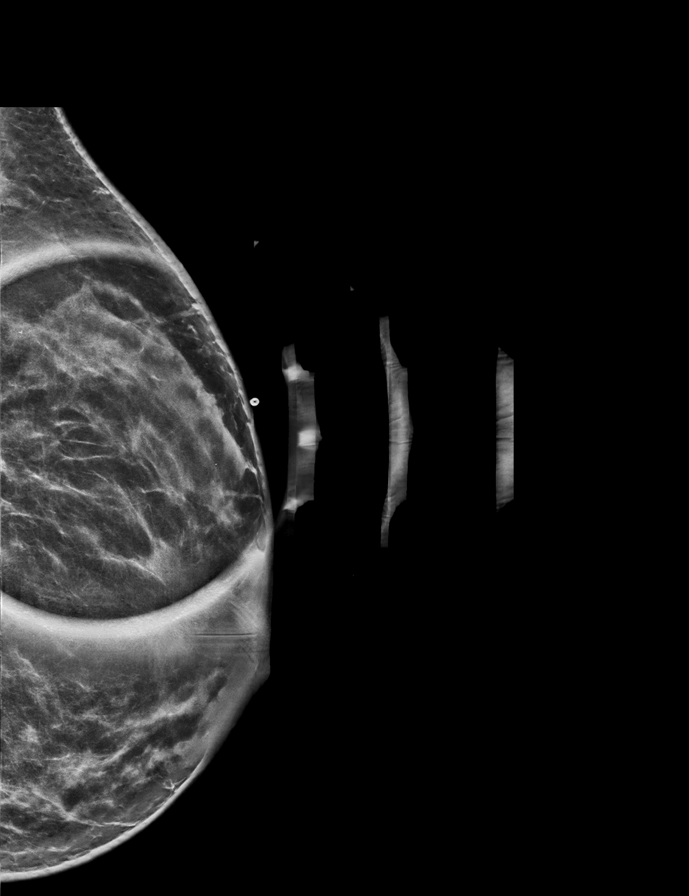

[L MLO]
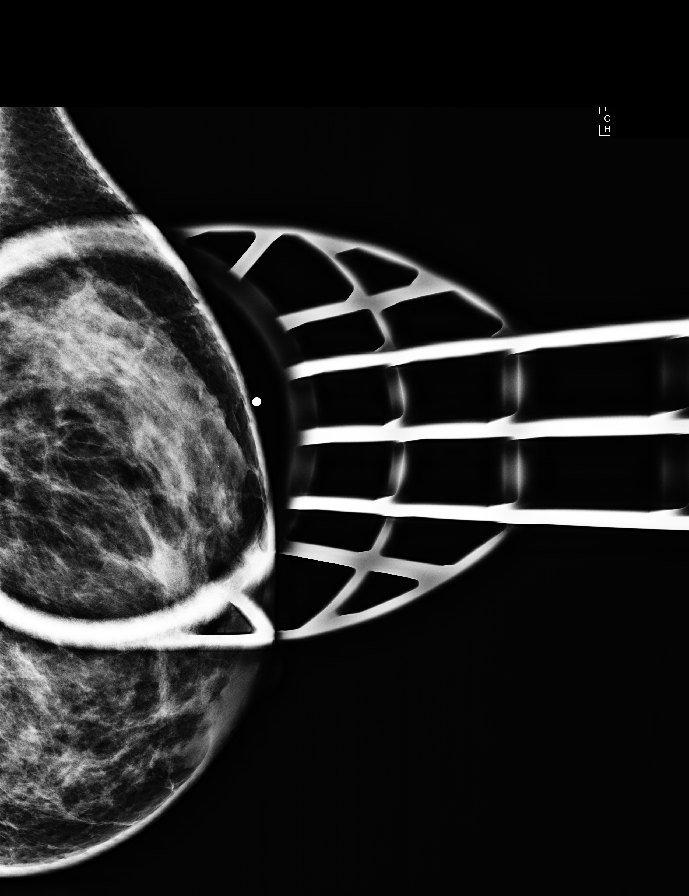

[R MLO synth-2D]
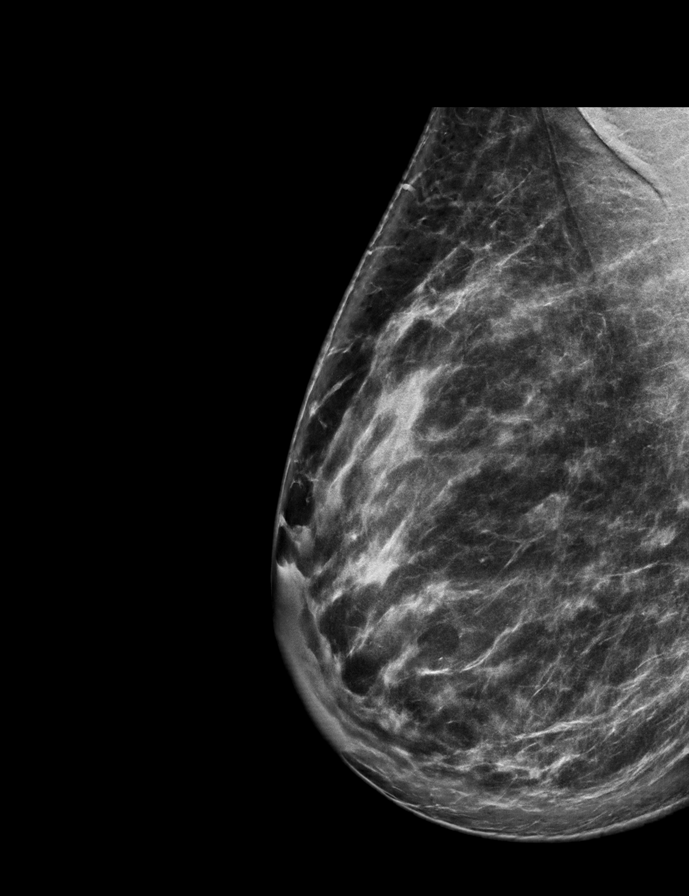

[R MLO]
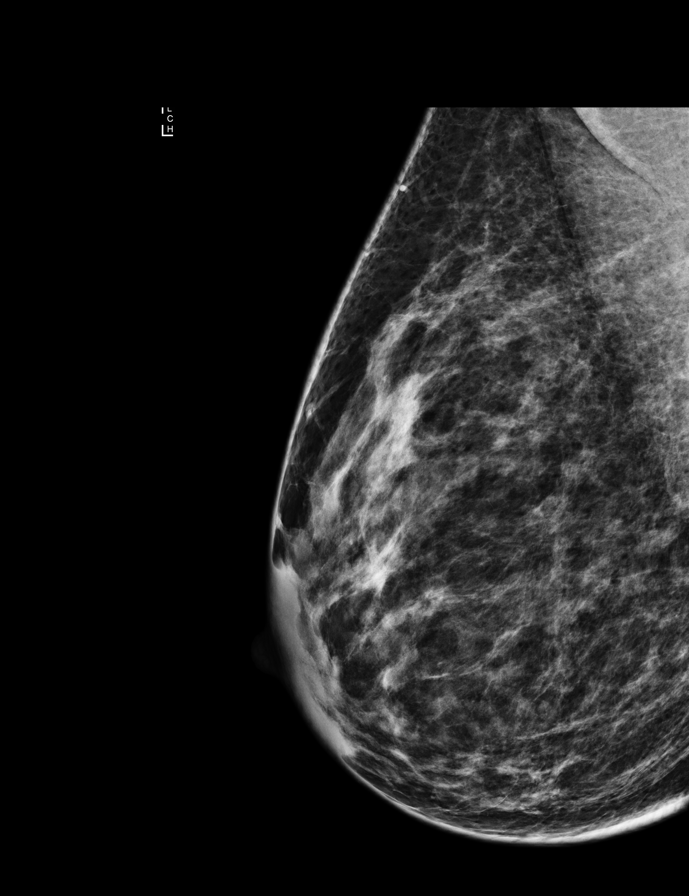

[L MLO synth-2D (2 of 2)]
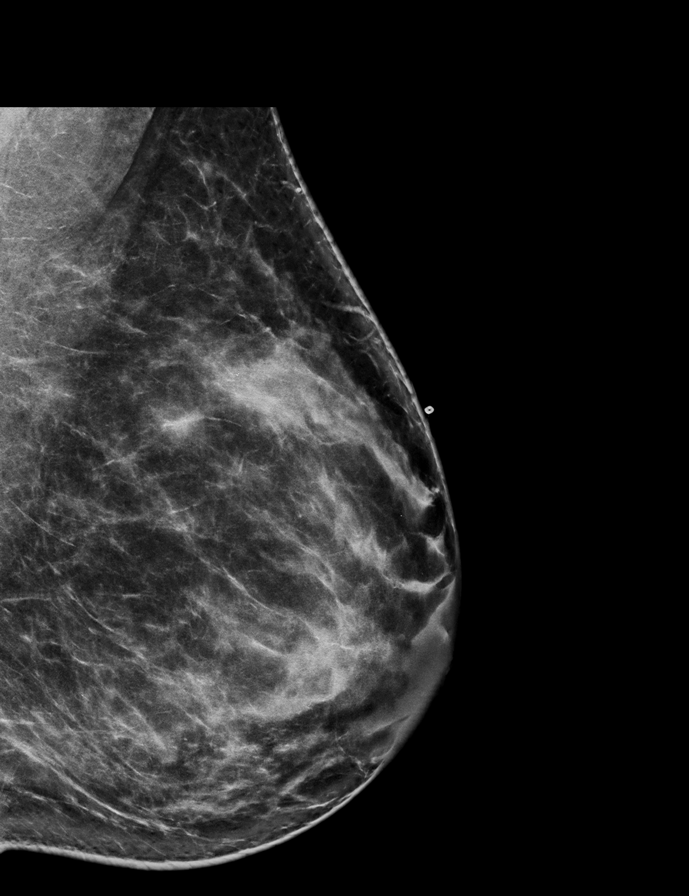

[R CC]
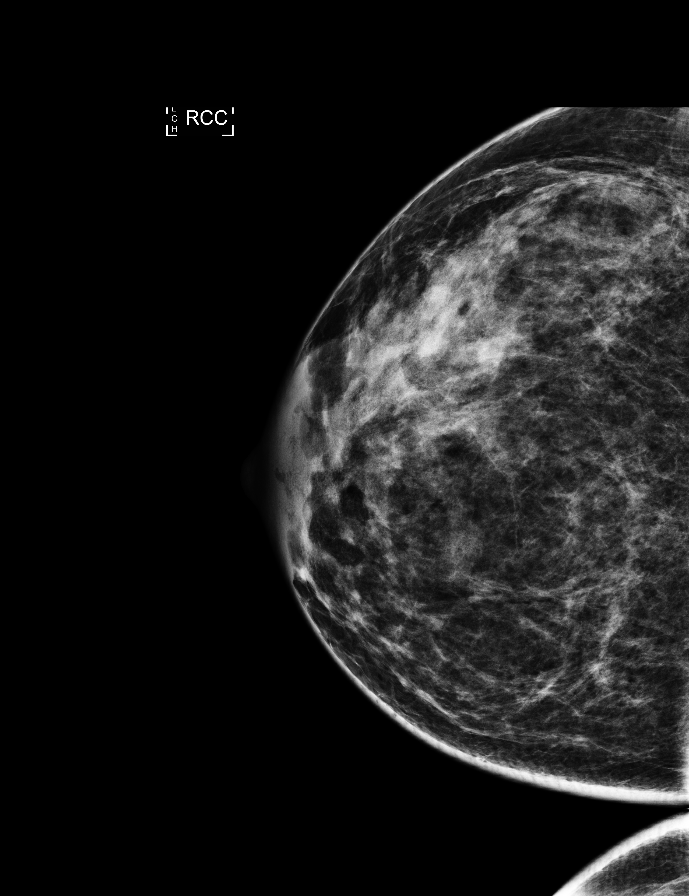

[8 of 35 positions shown; findings below may reference images not displayed]

ACR Breast Density Category c: The breast tissue is heterogeneously
dense, which may obscure small masses.
FINDINGS: Mammographically, there are no suspicious masses, areas of
architectural distortion or microcalcifications in the right breast.

In the area of palpable concern, left breast upper outer quadrant,
middle depth, there is an ill-defined non mass appearing focal
asymmetry.

Mammographic images were processed with CAD.

On physical exam, there is a moderately firm flat area of palpable
concern in the left breast upper outer quadrant.

Targeted ultrasound is performed, showing an ill-defined area of
hypo echoic tissue in the left breast 1 o'clock 3 cm from the nipple
measuring approximately 1.5 x 3.6 x 1.2 cm. This corresponds to the
area of palpable concern. There is no evidence of left axillary
lymphadenopathy.
IMPRESSION: Left breast 1 o'clock ill-defined area of hypoechoic tissue, which
corresponds to a palpable abnormality and focal asymmetry
mammographically. This may represent an area of asymmetrically dense
breast tissue, Pash, diabetic mastopathy, however malignancy cannot
be excluded and therefore ultrasound-guided core needle biopsy is
recommended.

RECOMMENDATION:
Ultrasound guided core needle biopsy of the left breast.

I have discussed the findings and recommendations with the patient.
Results were also provided in writing at the conclusion of the
visit. If applicable, a reminder letter will be sent to the patient
regarding the next appointment.

BI-RADS CATEGORY  4: Suspicious.

## 2021-10-14 ENCOUNTER — Other Ambulatory Visit (HOSPITAL_COMMUNITY): Payer: Self-pay
